# Patient Record
Sex: Female | Born: 1945 | Race: Black or African American | Hispanic: No | Smoking: Former smoker
Health system: Southern US, Community
[De-identification: ages and names within clinical notes are randomized; demographics above are authoritative.]

## PROBLEM LIST (undated history)

## (undated) DIAGNOSIS — R011 Cardiac murmur, unspecified: Secondary | ICD-10-CM

## (undated) DIAGNOSIS — R002 Palpitations: Secondary | ICD-10-CM

## (undated) DIAGNOSIS — E7800 Pure hypercholesterolemia, unspecified: Secondary | ICD-10-CM

## (undated) DIAGNOSIS — F329 Major depressive disorder, single episode, unspecified: Secondary | ICD-10-CM

## (undated) DIAGNOSIS — G4730 Sleep apnea, unspecified: Secondary | ICD-10-CM

## (undated) DIAGNOSIS — I8290 Acute embolism and thrombosis of unspecified vein: Secondary | ICD-10-CM

## (undated) DIAGNOSIS — K279 Peptic ulcer, site unspecified, unspecified as acute or chronic, without hemorrhage or perforation: Secondary | ICD-10-CM

## (undated) DIAGNOSIS — I10 Essential (primary) hypertension: Secondary | ICD-10-CM

## (undated) DIAGNOSIS — F419 Anxiety disorder, unspecified: Secondary | ICD-10-CM

## (undated) DIAGNOSIS — F32A Depression, unspecified: Secondary | ICD-10-CM

## (undated) DIAGNOSIS — K219 Gastro-esophageal reflux disease without esophagitis: Secondary | ICD-10-CM

## (undated) DIAGNOSIS — J40 Bronchitis, not specified as acute or chronic: Secondary | ICD-10-CM

## (undated) DIAGNOSIS — M069 Rheumatoid arthritis, unspecified: Secondary | ICD-10-CM

## (undated) HISTORY — PX: CHOLECYSTECTOMY: SHX55

## (undated) HISTORY — DX: Cardiac murmur, unspecified: R01.1

## (undated) HISTORY — DX: Rheumatoid arthritis, unspecified: M06.9

## (undated) HISTORY — PX: VAGINAL HYSTERECTOMY: SUR661

## (undated) HISTORY — DX: Anxiety disorder, unspecified: F41.9

## (undated) HISTORY — PX: NOSE SURGERY: SHX723

## (undated) HISTORY — PX: FOOT SURGERY: SHX648

## (undated) HISTORY — PX: MOUTH SURGERY: SHX715

## (undated) HISTORY — DX: Major depressive disorder, single episode, unspecified: F32.9

## (undated) HISTORY — DX: Bronchitis, not specified as acute or chronic: J40

## (undated) HISTORY — PX: EYE SURGERY: SHX253

## (undated) HISTORY — DX: Sleep apnea, unspecified: G47.30

## (undated) HISTORY — DX: Pure hypercholesterolemia, unspecified: E78.00

## (undated) HISTORY — DX: Peptic ulcer, site unspecified, unspecified as acute or chronic, without hemorrhage or perforation: K27.9

## (undated) HISTORY — DX: Palpitations: R00.2

## (undated) HISTORY — DX: Depression, unspecified: F32.A

## (undated) HISTORY — DX: Essential (primary) hypertension: I10

## (undated) HISTORY — DX: Gastro-esophageal reflux disease without esophagitis: K21.9

## (undated) HISTORY — DX: Acute embolism and thrombosis of unspecified vein: I82.90

---

## 1998-09-02 ENCOUNTER — Other Ambulatory Visit: Admission: RE | Admit: 1998-09-02 | Discharge: 1998-09-02 | Payer: Self-pay | Admitting: Obstetrics and Gynecology

## 1998-09-12 ENCOUNTER — Emergency Department (HOSPITAL_COMMUNITY): Admission: EM | Admit: 1998-09-12 | Discharge: 1998-09-13 | Payer: Self-pay | Admitting: Emergency Medicine

## 1998-09-13 ENCOUNTER — Encounter: Payer: Self-pay | Admitting: Emergency Medicine

## 1998-11-18 ENCOUNTER — Emergency Department (HOSPITAL_COMMUNITY): Admission: EM | Admit: 1998-11-18 | Discharge: 1998-11-19 | Payer: Self-pay | Admitting: Emergency Medicine

## 1998-11-18 ENCOUNTER — Encounter: Payer: Self-pay | Admitting: Emergency Medicine

## 1999-01-06 ENCOUNTER — Encounter: Payer: Self-pay | Admitting: Endocrinology

## 1999-01-06 ENCOUNTER — Ambulatory Visit (HOSPITAL_COMMUNITY): Admission: RE | Admit: 1999-01-06 | Discharge: 1999-01-06 | Payer: Self-pay | Admitting: Endocrinology

## 1999-02-28 ENCOUNTER — Emergency Department (HOSPITAL_COMMUNITY): Admission: EM | Admit: 1999-02-28 | Discharge: 1999-02-28 | Payer: Self-pay

## 1999-11-17 ENCOUNTER — Encounter: Payer: Self-pay | Admitting: Emergency Medicine

## 1999-11-17 ENCOUNTER — Emergency Department (HOSPITAL_COMMUNITY): Admission: EM | Admit: 1999-11-17 | Discharge: 1999-11-17 | Payer: Self-pay | Admitting: Emergency Medicine

## 2000-01-11 ENCOUNTER — Emergency Department (HOSPITAL_COMMUNITY): Admission: EM | Admit: 2000-01-11 | Discharge: 2000-01-11 | Payer: Self-pay | Admitting: Emergency Medicine

## 2000-01-11 ENCOUNTER — Encounter: Payer: Self-pay | Admitting: Emergency Medicine

## 2000-04-08 ENCOUNTER — Other Ambulatory Visit: Admission: RE | Admit: 2000-04-08 | Discharge: 2000-04-08 | Payer: Self-pay | Admitting: Obstetrics and Gynecology

## 2001-05-03 ENCOUNTER — Ambulatory Visit (HOSPITAL_BASED_OUTPATIENT_CLINIC_OR_DEPARTMENT_OTHER): Admission: RE | Admit: 2001-05-03 | Discharge: 2001-05-03 | Payer: Self-pay | Admitting: Otolaryngology

## 2001-09-15 ENCOUNTER — Other Ambulatory Visit: Admission: RE | Admit: 2001-09-15 | Discharge: 2001-09-15 | Payer: Self-pay | Admitting: Obstetrics and Gynecology

## 2001-12-06 ENCOUNTER — Ambulatory Visit: Admission: RE | Admit: 2001-12-06 | Discharge: 2001-12-06 | Payer: Self-pay | Admitting: Obstetrics and Gynecology

## 2001-12-06 ENCOUNTER — Encounter: Payer: Self-pay | Admitting: Obstetrics and Gynecology

## 2001-12-12 ENCOUNTER — Emergency Department (HOSPITAL_COMMUNITY): Admission: EM | Admit: 2001-12-12 | Discharge: 2001-12-12 | Payer: Self-pay | Admitting: Emergency Medicine

## 2001-12-30 ENCOUNTER — Ambulatory Visit (HOSPITAL_COMMUNITY): Admission: RE | Admit: 2001-12-30 | Discharge: 2001-12-30 | Payer: Self-pay | Admitting: Obstetrics and Gynecology

## 2002-01-25 ENCOUNTER — Encounter: Payer: Self-pay | Admitting: *Deleted

## 2002-01-25 ENCOUNTER — Encounter: Admission: RE | Admit: 2002-01-25 | Discharge: 2002-01-25 | Payer: Self-pay | Admitting: *Deleted

## 2002-03-02 ENCOUNTER — Ambulatory Visit (HOSPITAL_COMMUNITY): Admission: RE | Admit: 2002-03-02 | Discharge: 2002-03-02 | Payer: Self-pay | Admitting: *Deleted

## 2002-04-13 ENCOUNTER — Encounter: Admission: RE | Admit: 2002-04-13 | Discharge: 2002-04-13 | Payer: Self-pay | Admitting: Obstetrics and Gynecology

## 2002-04-13 ENCOUNTER — Encounter: Payer: Self-pay | Admitting: Obstetrics and Gynecology

## 2002-05-29 ENCOUNTER — Encounter: Payer: Self-pay | Admitting: Ophthalmology

## 2002-05-29 ENCOUNTER — Encounter: Admission: RE | Admit: 2002-05-29 | Discharge: 2002-05-29 | Payer: Self-pay | Admitting: Ophthalmology

## 2002-10-25 ENCOUNTER — Other Ambulatory Visit: Admission: RE | Admit: 2002-10-25 | Discharge: 2002-10-25 | Payer: Self-pay | Admitting: Obstetrics and Gynecology

## 2002-11-24 ENCOUNTER — Emergency Department (HOSPITAL_COMMUNITY): Admission: EM | Admit: 2002-11-24 | Discharge: 2002-11-24 | Payer: Self-pay | Admitting: Emergency Medicine

## 2002-11-24 ENCOUNTER — Encounter: Payer: Self-pay | Admitting: Emergency Medicine

## 2002-12-01 ENCOUNTER — Encounter: Admission: RE | Admit: 2002-12-01 | Discharge: 2002-12-01 | Payer: Self-pay | Admitting: Endocrinology

## 2002-12-01 ENCOUNTER — Encounter: Payer: Self-pay | Admitting: Endocrinology

## 2003-09-25 ENCOUNTER — Emergency Department (HOSPITAL_COMMUNITY): Admission: EM | Admit: 2003-09-25 | Discharge: 2003-09-26 | Payer: Self-pay

## 2003-10-18 ENCOUNTER — Emergency Department (HOSPITAL_COMMUNITY): Admission: EM | Admit: 2003-10-18 | Discharge: 2003-10-18 | Payer: Self-pay | Admitting: Emergency Medicine

## 2003-11-05 ENCOUNTER — Ambulatory Visit (HOSPITAL_COMMUNITY): Admission: RE | Admit: 2003-11-05 | Discharge: 2003-11-05 | Payer: Self-pay | Admitting: Cardiology

## 2003-12-25 ENCOUNTER — Ambulatory Visit (HOSPITAL_COMMUNITY): Admission: RE | Admit: 2003-12-25 | Discharge: 2003-12-25 | Payer: Self-pay | Admitting: Endocrinology

## 2004-03-12 ENCOUNTER — Emergency Department (HOSPITAL_COMMUNITY): Admission: EM | Admit: 2004-03-12 | Discharge: 2004-03-12 | Payer: Self-pay | Admitting: Emergency Medicine

## 2004-05-04 ENCOUNTER — Emergency Department (HOSPITAL_COMMUNITY): Admission: EM | Admit: 2004-05-04 | Discharge: 2004-05-04 | Payer: Self-pay | Admitting: *Deleted

## 2004-05-19 ENCOUNTER — Other Ambulatory Visit: Admission: RE | Admit: 2004-05-19 | Discharge: 2004-05-19 | Payer: Self-pay | Admitting: Obstetrics and Gynecology

## 2004-07-03 ENCOUNTER — Ambulatory Visit (HOSPITAL_COMMUNITY): Admission: RE | Admit: 2004-07-03 | Discharge: 2004-07-03 | Payer: Self-pay | Admitting: Obstetrics and Gynecology

## 2004-11-08 ENCOUNTER — Emergency Department (HOSPITAL_COMMUNITY): Admission: EM | Admit: 2004-11-08 | Discharge: 2004-11-09 | Payer: Self-pay | Admitting: Emergency Medicine

## 2005-10-12 ENCOUNTER — Encounter (INDEPENDENT_AMBULATORY_CARE_PROVIDER_SITE_OTHER): Payer: Self-pay | Admitting: *Deleted

## 2005-10-13 ENCOUNTER — Inpatient Hospital Stay (HOSPITAL_COMMUNITY): Admission: RE | Admit: 2005-10-13 | Discharge: 2005-10-14 | Payer: Self-pay

## 2006-03-26 ENCOUNTER — Encounter: Admission: RE | Admit: 2006-03-26 | Discharge: 2006-03-26 | Payer: Self-pay | Admitting: Endocrinology

## 2006-06-29 ENCOUNTER — Ambulatory Visit (HOSPITAL_COMMUNITY): Admission: RE | Admit: 2006-06-29 | Discharge: 2006-06-29 | Payer: Self-pay | Admitting: *Deleted

## 2006-07-05 ENCOUNTER — Ambulatory Visit (HOSPITAL_COMMUNITY): Admission: RE | Admit: 2006-07-05 | Discharge: 2006-07-05 | Payer: Self-pay | Admitting: *Deleted

## 2006-07-21 ENCOUNTER — Emergency Department (HOSPITAL_COMMUNITY): Admission: EM | Admit: 2006-07-21 | Discharge: 2006-07-21 | Payer: Self-pay | Admitting: Emergency Medicine

## 2006-09-19 ENCOUNTER — Inpatient Hospital Stay (HOSPITAL_COMMUNITY): Admission: EM | Admit: 2006-09-19 | Discharge: 2006-09-25 | Payer: Self-pay | Admitting: Emergency Medicine

## 2006-09-21 ENCOUNTER — Encounter (INDEPENDENT_AMBULATORY_CARE_PROVIDER_SITE_OTHER): Payer: Self-pay | Admitting: *Deleted

## 2007-02-08 ENCOUNTER — Emergency Department (HOSPITAL_COMMUNITY): Admission: EM | Admit: 2007-02-08 | Discharge: 2007-02-09 | Payer: Self-pay | Admitting: *Deleted

## 2007-04-05 ENCOUNTER — Emergency Department (HOSPITAL_COMMUNITY): Admission: EM | Admit: 2007-04-05 | Discharge: 2007-04-06 | Payer: Self-pay | Admitting: Emergency Medicine

## 2007-09-18 ENCOUNTER — Encounter: Admission: RE | Admit: 2007-09-18 | Discharge: 2007-09-18 | Payer: Self-pay | Admitting: Internal Medicine

## 2009-02-28 ENCOUNTER — Emergency Department (HOSPITAL_COMMUNITY): Admission: EM | Admit: 2009-02-28 | Discharge: 2009-03-01 | Payer: Self-pay | Admitting: Emergency Medicine

## 2009-04-09 ENCOUNTER — Ambulatory Visit (HOSPITAL_BASED_OUTPATIENT_CLINIC_OR_DEPARTMENT_OTHER): Admission: RE | Admit: 2009-04-09 | Discharge: 2009-04-09 | Payer: Self-pay | Admitting: Urology

## 2009-07-02 ENCOUNTER — Emergency Department (HOSPITAL_COMMUNITY): Admission: EM | Admit: 2009-07-02 | Discharge: 2009-07-02 | Payer: Self-pay | Admitting: Emergency Medicine

## 2009-07-04 ENCOUNTER — Emergency Department (HOSPITAL_COMMUNITY): Admission: EM | Admit: 2009-07-04 | Discharge: 2009-07-04 | Payer: Self-pay | Admitting: Emergency Medicine

## 2009-07-06 ENCOUNTER — Inpatient Hospital Stay (HOSPITAL_COMMUNITY): Admission: EM | Admit: 2009-07-06 | Discharge: 2009-07-11 | Payer: Self-pay | Admitting: Emergency Medicine

## 2009-07-08 ENCOUNTER — Encounter (INDEPENDENT_AMBULATORY_CARE_PROVIDER_SITE_OTHER): Payer: Self-pay | Admitting: Internal Medicine

## 2009-07-10 ENCOUNTER — Ambulatory Visit: Payer: Self-pay | Admitting: Physical Medicine & Rehabilitation

## 2010-07-01 ENCOUNTER — Emergency Department (HOSPITAL_COMMUNITY)
Admission: EM | Admit: 2010-07-01 | Discharge: 2010-07-01 | Payer: Self-pay | Source: Home / Self Care | Admitting: Emergency Medicine

## 2010-07-13 ENCOUNTER — Encounter
Admission: RE | Admit: 2010-07-13 | Discharge: 2010-07-13 | Payer: Self-pay | Source: Home / Self Care | Attending: Orthopedic Surgery | Admitting: Orthopedic Surgery

## 2010-07-18 ENCOUNTER — Encounter
Admission: RE | Admit: 2010-07-18 | Discharge: 2010-07-18 | Payer: Self-pay | Source: Home / Self Care | Attending: Orthopedic Surgery | Admitting: Orthopedic Surgery

## 2010-08-13 ENCOUNTER — Encounter
Admission: RE | Admit: 2010-08-13 | Discharge: 2010-08-13 | Payer: Self-pay | Source: Home / Self Care | Attending: Orthopedic Surgery | Admitting: Orthopedic Surgery

## 2010-08-17 ENCOUNTER — Encounter: Payer: Self-pay | Admitting: Endocrinology

## 2010-08-17 ENCOUNTER — Encounter: Payer: Self-pay | Admitting: *Deleted

## 2010-09-12 ENCOUNTER — Other Ambulatory Visit: Payer: Self-pay | Admitting: Orthopedic Surgery

## 2010-09-12 DIAGNOSIS — IMO0002 Reserved for concepts with insufficient information to code with codable children: Secondary | ICD-10-CM

## 2010-09-16 ENCOUNTER — Ambulatory Visit
Admission: RE | Admit: 2010-09-16 | Discharge: 2010-09-16 | Disposition: A | Discharge status: Home / Self Care | Payer: Medicare Other | Source: Ambulatory Visit | Attending: Orthopedic Surgery | Admitting: Orthopedic Surgery

## 2010-09-16 DIAGNOSIS — IMO0002 Reserved for concepts with insufficient information to code with codable children: Secondary | ICD-10-CM

## 2010-10-06 LAB — URINALYSIS, ROUTINE W REFLEX MICROSCOPIC
Glucose, UA: NEGATIVE mg/dL
Protein, ur: NEGATIVE mg/dL
Specific Gravity, Urine: 1.016 (ref 1.005–1.030)
pH: 7.5 (ref 5.0–8.0)

## 2010-10-06 LAB — POCT I-STAT, CHEM 8
Calcium, Ion: 0.95 mmol/L — ABNORMAL LOW (ref 1.12–1.32)
Creatinine, Ser: 0.8 mg/dL (ref 0.4–1.2)
Glucose, Bld: 99 mg/dL (ref 70–99)
Hemoglobin: 14.3 g/dL (ref 12.0–15.0)
Sodium: 137 mEq/L (ref 135–145)
TCO2: 30 mmol/L (ref 0–100)

## 2010-10-06 LAB — CBC
MCH: 29.6 pg (ref 26.0–34.0)
MCV: 91.3 fL (ref 78.0–100.0)
Platelets: 167 10*3/uL (ref 150–400)
RBC: 4.25 MIL/uL (ref 3.87–5.11)

## 2010-10-06 LAB — URINE CULTURE: Culture  Setup Time: 201112070146

## 2010-10-06 LAB — DIFFERENTIAL
Eosinophils Absolute: 0 10*3/uL (ref 0.0–0.7)
Eosinophils Relative: 1 % (ref 0–5)
Lymphs Abs: 1.6 10*3/uL (ref 0.7–4.0)

## 2010-10-09 HISTORY — PX: OTHER SURGICAL HISTORY: SHX169

## 2010-10-28 LAB — CARDIAC PANEL(CRET KIN+CKTOT+MB+TROPI)
CK, MB: 0.6 ng/mL (ref 0.3–4.0)
Relative Index: INVALID (ref 0.0–2.5)
Total CK: 123 U/L (ref 7–177)
Total CK: 93 U/L (ref 7–177)
Troponin I: 0.01 ng/mL (ref 0.00–0.06)
Troponin I: 0.02 ng/mL (ref 0.00–0.06)

## 2010-10-28 LAB — POCT I-STAT, CHEM 8
BUN: 15 mg/dL (ref 6–23)
Calcium, Ion: 1.12 mmol/L (ref 1.12–1.32)
Chloride: 104 mEq/L (ref 96–112)
Creatinine, Ser: 0.7 mg/dL (ref 0.4–1.2)
Glucose, Bld: 116 mg/dL — ABNORMAL HIGH (ref 70–99)
TCO2: 29 mmol/L (ref 0–100)

## 2010-10-28 LAB — DIFFERENTIAL
Basophils Absolute: 0 10*3/uL (ref 0.0–0.1)
Basophils Absolute: 0 10*3/uL (ref 0.0–0.1)
Basophils Relative: 0 % (ref 0–1)
Basophils Relative: 1 % (ref 0–1)
Eosinophils Absolute: 0 10*3/uL (ref 0.0–0.7)
Eosinophils Absolute: 0 10*3/uL (ref 0.0–0.7)
Eosinophils Relative: 1 % (ref 0–5)
Lymphs Abs: 1.6 10*3/uL (ref 0.7–4.0)
Neutro Abs: 1.8 10*3/uL (ref 1.7–7.7)
Neutrophils Relative %: 40 % — ABNORMAL LOW (ref 43–77)
Neutrophils Relative %: 53 % (ref 43–77)

## 2010-10-28 LAB — BASIC METABOLIC PANEL
BUN: 12 mg/dL (ref 6–23)
BUN: 3 mg/dL — ABNORMAL LOW (ref 6–23)
BUN: 5 mg/dL — ABNORMAL LOW (ref 6–23)
CO2: 23 mEq/L (ref 19–32)
CO2: 27 mEq/L (ref 19–32)
Calcium: 8 mg/dL — ABNORMAL LOW (ref 8.4–10.5)
Calcium: 9.2 mg/dL (ref 8.4–10.5)
Calcium: 9.7 mg/dL (ref 8.4–10.5)
Chloride: 113 mEq/L — ABNORMAL HIGH (ref 96–112)
Creatinine, Ser: 0.62 mg/dL (ref 0.4–1.2)
Creatinine, Ser: 0.69 mg/dL (ref 0.4–1.2)
Creatinine, Ser: 0.85 mg/dL (ref 0.4–1.2)
GFR calc Af Amer: >60 mL/min (ref >60)
GFR calc Af Amer: >60 mL/min (ref >60)
GFR calc Af Amer: >60 mL/min (ref >60)
GFR calc Af Amer: >60 mL/min (ref >60)
Glucose, Bld: 84 mg/dL (ref 70–99)
Potassium: 3.1 mEq/L — ABNORMAL LOW (ref 3.5–5.1)
Sodium: 143 mEq/L (ref 135–145)

## 2010-10-28 LAB — PROTIME-INR: Prothrombin Time: 13.6 seconds (ref 11.6–15.2)

## 2010-10-28 LAB — CBC
Hemoglobin: 13.2 g/dL (ref 12.0–15.0)
MCHC: 32.3 g/dL (ref 30.0–36.0)
MCHC: 32.8 g/dL (ref 30.0–36.0)
MCHC: 32.8 g/dL (ref 30.0–36.0)
MCV: 93.6 fL (ref 78.0–100.0)
Platelets: 128 10*3/uL — ABNORMAL LOW (ref 150–400)
Platelets: 179 10*3/uL (ref 150–400)
RBC: 3.42 MIL/uL — ABNORMAL LOW (ref 3.87–5.11)
RBC: 4.3 MIL/uL (ref 3.87–5.11)
RDW: 14 % (ref 11.5–15.5)
WBC: 4.1 10*3/uL (ref 4.0–10.5)

## 2010-10-28 LAB — CK TOTAL AND CKMB (NOT AT ARMC)
CK, MB: 1 ng/mL (ref 0.3–4.0)
Total CK: 104 U/L (ref 7–177)

## 2010-10-28 LAB — CULTURE, BLOOD (ROUTINE X 2): Culture: NO GROWTH

## 2010-10-28 LAB — POCT CARDIAC MARKERS
CKMB, poc: 1.1 ng/mL (ref 1.0–8.0)
CKMB, poc: <1 ng/mL — ABNORMAL LOW (ref 1.0–8.0)
Myoglobin, poc: 87 ng/mL (ref 12–200)

## 2010-10-28 LAB — LIPID PANEL
LDL Cholesterol: 71 mg/dL (ref 0–99)
VLDL: 16 mg/dL (ref 0–40)

## 2010-10-28 LAB — URINALYSIS, ROUTINE W REFLEX MICROSCOPIC
Bilirubin Urine: NEGATIVE
Hgb urine dipstick: NEGATIVE
Specific Gravity, Urine: >1.046 — ABNORMAL HIGH (ref 1.005–1.030)
pH: 5.5 (ref 5.0–8.0)

## 2010-10-28 LAB — PHOSPHORUS
Phosphorus: 2.1 mg/dL — ABNORMAL LOW (ref 2.3–4.6)
Phosphorus: 2.4 mg/dL (ref 2.3–4.6)

## 2010-10-28 LAB — COMPREHENSIVE METABOLIC PANEL
ALT: 18 U/L (ref 0–35)
AST: 22 U/L (ref 0–37)
CO2: 26 mEq/L (ref 19–32)
Calcium: 9.9 mg/dL (ref 8.4–10.5)
Chloride: 105 mEq/L (ref 96–112)
GFR calc non Af Amer: >60 mL/min (ref >60)
Glucose, Bld: 116 mg/dL — ABNORMAL HIGH (ref 70–99)
Sodium: 141 mEq/L (ref 135–145)
Total Bilirubin: 0.7 mg/dL (ref 0.3–1.2)

## 2010-10-28 LAB — RAPID URINE DRUG SCREEN, HOSP PERFORMED
Benzodiazepines: NOT DETECTED
Cocaine: NOT DETECTED
Tetrahydrocannabinol: NOT DETECTED

## 2010-10-28 LAB — MAGNESIUM
Magnesium: 2.1 mg/dL (ref 1.5–2.5)
Magnesium: 2.1 mg/dL (ref 1.5–2.5)

## 2010-10-28 LAB — RPR: RPR Ser Ql: NONREACTIVE

## 2010-10-28 LAB — TROPONIN I: Troponin I: 0.01 ng/mL (ref 0.00–0.06)

## 2010-10-28 LAB — URINE CULTURE

## 2010-10-28 LAB — LIPASE, BLOOD: Lipase: 21 U/L (ref 11–59)

## 2010-10-28 LAB — HIV ANTIBODY (ROUTINE TESTING W REFLEX): HIV: NONREACTIVE

## 2010-10-28 LAB — SEDIMENTATION RATE: Sed Rate: 9 mm/hr (ref 0–22)

## 2010-10-28 LAB — LACTIC ACID, PLASMA: Lactic Acid, Venous: 1 mmol/L (ref 0.5–2.2)

## 2010-10-31 LAB — POCT I-STAT 4, (NA,K, GLUC, HGB,HCT)
Potassium: 3.6 mEq/L (ref 3.5–5.1)
Sodium: 141 mEq/L (ref 135–145)

## 2010-11-01 ENCOUNTER — Emergency Department (HOSPITAL_COMMUNITY): Payer: Medicare Other

## 2010-11-01 ENCOUNTER — Emergency Department (HOSPITAL_COMMUNITY)
Admission: EM | Admit: 2010-11-01 | Discharge: 2010-11-01 | Disposition: A | Discharge status: Home / Self Care | Payer: Medicare Other | Attending: Emergency Medicine | Admitting: Emergency Medicine

## 2010-11-01 DIAGNOSIS — M25559 Pain in unspecified hip: Secondary | ICD-10-CM | POA: Insufficient documentation

## 2010-11-01 DIAGNOSIS — M545 Low back pain, unspecified: Secondary | ICD-10-CM | POA: Insufficient documentation

## 2010-11-01 DIAGNOSIS — I10 Essential (primary) hypertension: Secondary | ICD-10-CM | POA: Insufficient documentation

## 2010-11-01 DIAGNOSIS — M5430 Sciatica, unspecified side: Secondary | ICD-10-CM | POA: Insufficient documentation

## 2010-11-01 LAB — CBC
HCT: 34.1 % — ABNORMAL LOW (ref 36.0–46.0)
Hemoglobin: 11.2 g/dL — ABNORMAL LOW (ref 12.0–15.0)
MCHC: 32.8 g/dL (ref 30.0–36.0)
Platelets: 147 10*3/uL — ABNORMAL LOW (ref 150–400)
RDW: 13.9 % (ref 11.5–15.5)

## 2010-11-01 LAB — DIFFERENTIAL
Basophils Absolute: 0.1 10*3/uL (ref 0.0–0.1)
Basophils Relative: 1 % (ref 0–1)
Eosinophils Absolute: 0.1 10*3/uL (ref 0.0–0.7)
Eosinophils Relative: 1 % (ref 0–5)
Lymphocytes Relative: 43 % (ref 12–46)
Monocytes Absolute: 0.5 10*3/uL (ref 0.1–1.0)

## 2010-11-01 LAB — COMPREHENSIVE METABOLIC PANEL
ALT: 15 U/L (ref 0–35)
Alkaline Phosphatase: 54 U/L (ref 39–117)
BUN: 10 mg/dL (ref 6–23)
CO2: 27 mEq/L (ref 19–32)
Calcium: 8.9 mg/dL (ref 8.4–10.5)
GFR calc Af Amer: >60 mL/min (ref >60)
GFR calc non Af Amer: >60 mL/min (ref >60)
Glucose, Bld: 103 mg/dL — ABNORMAL HIGH (ref 70–99)
Potassium: 4.3 mEq/L (ref 3.5–5.1)
Total Protein: 6.2 g/dL (ref 6.0–8.3)

## 2010-11-01 LAB — URINALYSIS, ROUTINE W REFLEX MICROSCOPIC
Bilirubin Urine: NEGATIVE
Glucose, UA: NEGATIVE mg/dL
Ketones, ur: NEGATIVE mg/dL
Specific Gravity, Urine: 1.008 (ref 1.005–1.030)
pH: 8 (ref 5.0–8.0)

## 2010-11-01 LAB — POCT CARDIAC MARKERS: Troponin i, poc: <0.05 ng/mL (ref 0.00–0.09)

## 2010-11-01 LAB — RAPID URINE DRUG SCREEN, HOSP PERFORMED
Benzodiazepines: NOT DETECTED
Cocaine: NOT DETECTED
Opiates: NOT DETECTED

## 2010-11-01 LAB — LIPASE, BLOOD: Lipase: 39 U/L (ref 11–59)

## 2010-11-06 ENCOUNTER — Other Ambulatory Visit: Payer: Self-pay | Admitting: Family Medicine

## 2010-11-06 DIAGNOSIS — M545 Low back pain, unspecified: Secondary | ICD-10-CM

## 2010-11-07 ENCOUNTER — Ambulatory Visit
Admission: RE | Admit: 2010-11-07 | Discharge: 2010-11-07 | Disposition: A | Discharge status: Home / Self Care | Payer: Medicare Other | Source: Ambulatory Visit | Attending: Family Medicine | Admitting: Family Medicine

## 2010-11-07 DIAGNOSIS — M545 Low back pain, unspecified: Secondary | ICD-10-CM

## 2010-11-20 ENCOUNTER — Other Ambulatory Visit (HOSPITAL_COMMUNITY): Payer: Medicare Other

## 2010-11-26 HISTORY — PX: DOPPLER ECHOCARDIOGRAPHY: SHX263

## 2010-11-28 HISTORY — PX: NM MYOVIEW LTD: HXRAD82

## 2010-12-09 ENCOUNTER — Observation Stay (HOSPITAL_COMMUNITY)
Admission: RE | Admit: 2010-12-09 | Discharge: 2010-12-12 | Disposition: A | Discharge status: Home / Self Care | Payer: Medicare Other | Source: Ambulatory Visit | Attending: Specialist | Admitting: Specialist

## 2010-12-09 ENCOUNTER — Ambulatory Visit (HOSPITAL_COMMUNITY): Payer: Medicare Other

## 2010-12-09 DIAGNOSIS — Z79899 Other long term (current) drug therapy: Secondary | ICD-10-CM | POA: Insufficient documentation

## 2010-12-09 DIAGNOSIS — Z01818 Encounter for other preprocedural examination: Secondary | ICD-10-CM | POA: Insufficient documentation

## 2010-12-09 DIAGNOSIS — M5126 Other intervertebral disc displacement, lumbar region: Principal | ICD-10-CM | POA: Insufficient documentation

## 2010-12-09 LAB — SURGICAL PCR SCREEN: Staphylococcus aureus: POSITIVE — AB

## 2010-12-09 LAB — URINALYSIS, ROUTINE W REFLEX MICROSCOPIC
Glucose, UA: NEGATIVE mg/dL
Ketones, ur: NEGATIVE mg/dL
Protein, ur: NEGATIVE mg/dL
Urobilinogen, UA: 0.2 mg/dL (ref 0.0–1.0)

## 2010-12-09 LAB — BASIC METABOLIC PANEL
BUN: 13 mg/dL (ref 6–23)
CO2: 28 mEq/L (ref 19–32)
Calcium: 10 mg/dL (ref 8.4–10.5)
Chloride: 94 mEq/L — ABNORMAL LOW (ref 96–112)
Creatinine, Ser: 0.69 mg/dL (ref 0.4–1.2)
GFR calc Af Amer: >60 mL/min (ref >60)
GFR calc non Af Amer: >60 mL/min (ref >60)
Glucose, Bld: 129 mg/dL — ABNORMAL HIGH (ref 70–99)
Potassium: 4 mEq/L (ref 3.5–5.1)
Sodium: 131 mEq/L — ABNORMAL LOW (ref 135–145)

## 2010-12-09 LAB — PROTIME-INR
INR: 0.92 (ref 0.00–1.49)
Prothrombin Time: 12.6 seconds (ref 11.6–15.2)

## 2010-12-09 LAB — CBC
HCT: 39 % (ref 36.0–46.0)
Hemoglobin: 12.9 g/dL (ref 12.0–15.0)
MCH: 30.6 pg (ref 26.0–34.0)
MCHC: 33.1 g/dL (ref 30.0–36.0)
MCV: 92.4 fL (ref 78.0–100.0)
Platelets: 215 10*3/uL (ref 150–400)
RBC: 4.22 MIL/uL (ref 3.87–5.11)
RDW: 13.2 % (ref 11.5–15.5)
WBC: 5.1 10*3/uL (ref 4.0–10.5)

## 2010-12-09 LAB — DIFFERENTIAL
Basophils Absolute: 0 10*3/uL (ref 0.0–0.1)
Basophils Relative: 0 % (ref 0–1)
Eosinophils Absolute: 0 10*3/uL (ref 0.0–0.7)
Eosinophils Relative: 1 % (ref 0–5)
Lymphs Abs: 2.2 10*3/uL (ref 0.7–4.0)
Neutrophils Relative %: 47 % (ref 43–77)

## 2010-12-10 NOTE — Op Note (Signed)
NAMEANVITHA, HUTMACHER              ACCOUNT NO.:  [id number]  MEDICAL RECORD NO.:  [id number]           PATIENT TYPE:  O  LOCATION:  5008                         FACILITY:  [facility]  PHYSICIAN:  Kerrin Champagne, M.D.   DATE OF BIRTH:  06/17/1946  DATE OF PROCEDURE:  12/09/2010 DATE OF DISCHARGE:                              OPERATIVE REPORT   PREOPERATIVE DIAGNOSIS:  Herniated nucleus pulposus, left side, L5-S1.  POSTOPERATIVE DIAGNOSIS:  Herniated nucleus pulposus, left side, L5-S1.  PROCEDURE:  Left L5-S1 MIS approach for microdiskectomy, microscope used during the procedure.  SURGEON:  Kerrin Champagne, MD  ASSISTANT:  Maud Deed, PA-C  ANESTHESIA:  General via orotracheal intubation, Dr. Diamantina Monks, supplemented with local infiltration with Marcaine 0.5% with 1:200,000 epinephrine, total of 9 mL.  FINDINGS:  Large HNP subligamentous with extruded fragment central and left side affecting the left-sided thecal sac and the left S1 nerve root, L5-S1.  ESTIMATED BLOOD LOSS:  Less than 10 mL.  COMPLICATIONS:  None.  DISPOSITION:  The patient returned to the PACU in good condition.  HISTORY OF PRESENT ILLNESS:  The patient is a 65 year old female who had disk herniation occur almost a year ago.  She was seen initially, underwent epidural steroid injections with good relief of pain, and has been treated conservatively over the past year.  This last December her pain worsened and she underwent further ESIs without relief.  MRI scan was done which demonstrated a large disk herniation central and left side at L5-S1 with left S1 nerve root compression.  Her findings clinically were consistent with S1 radiculopathy.  After attempts at further conservative management including therapy, attempts at further ESIs with use of narcotic medicines, pain has persisted with significant sciatic tension signs on the left side.  She is brought to the operating room to undergo microdiskectomy  for persistent sciatica, left lower extremity that is incapacitating, requiring narcotic medications over a prolonged period of time and without relief with conservative management over a long period of greater than 6 months.  DESCRIPTION OF PROCEDURE:  This patient had undergone previous evaluation at the office and a discussion regarding the risks and benefits of the operation to be performed.  She had signed informed consent.  The patient was seen in the preoperative holding area and had marking of the left lower back with an X and my initials at the expected L5-S1 level.  She received standard preoperative antibiotics of Ancef. She was transported the OR room #4 at [facility].  There she underwent induction of general anesthesia, intubation atraumatically, and Foley catheter placed.  She was then turned to prone position.  A sliding OR table was used with Wilson frame.  All pressure points were well-padded.  PAS hose.  Standard prep with DuraPrep solution from the lower dorsal spine to the mid sacral segment draped in the usual manner and then the I-drape was used.  Standard time-out protocol was carried out identifying the patient, procedure, the site of the procedure, expected blood loss, and length of the procedure.  Two spinal needles 18- gauge were then inserted at the expected L5-S1  level.  Intraoperative lateral C-arm brought into the field under the C-arm that was brought in sterilely.  Needles noted to be on either side of the disk space over the posterior aspect of posterior elements of the L5-S1 level so that the skin was infiltrated between these needles using Marcaine 0.5% with 1:200,000 epinephrine.  Needles were removed.  A small 1-inch incision was made into the skin and subcu layers down to the fascia.  This was then incised on the left side of the expected spinous process of L5-S1. Smallest muscle dilator was brought down and used to dilate the paralumbar  muscles on the left and these were dilated up to a 11-mm dilator.  A 50-mm depth measured off the dilators and 50-mm small blades were then inserted over the dilators.  In the upright for holding the frame for the MIS equipment, it was then sterilely attached to the OR table.  This was then attached to the frame to hold the retractors in place and this was stabilized.  Once this was stabilized and the retractors were then engaged and the distraction obtained between the upper and lower retractors, the blade was also placed medially.  To obtain excellent exposure at the expected L5-S1 level, a Penfield 4 was placed over the medial aspect of the facet at the L5-S1 level.  Intraoperative lateral C-arm then showed this to be at the level of the facet where the L5-S1 level.  C-arm was then removed from the field.  This permanent image was obtained for documentation purposes identifying the Penfield 4 over the medial facet at the L4, L5, and, S1 level.  Loupe magnification headlamp was used to carefully remove a small portion inferior aspect of lamina of L5 on the left up to the insertion of the ligamentum flavum to the ventral surface of the lamina.  This was then retracted distally with a nerve hook and resected using 3-mm Kerrisons off the posterior aspect of the thecal sac extending downwards to the lamina of S1.  It was then resected off the medial aspect of facet at the L5-S1 level.  Operating room microscope sterilely draped, brought into the field.  Under the OR microscope, then further debridement of medial aspect of L5-S1 facet, ligamentum flavum approximately 5% of the medial facet was resected in order to allow for further exposure of lateral recess.  Thecal sac was retracted medially and the S1 nerve root identified.  Nerve root was carefully freed up. Several large epidural veins were cauterized and divided.  Cottonoid placed distally to protect the S1 nerve root and the disk  herniation was immediately visible ventral to the S1 nerve root and the thecal sac at this level.  The #15 blade scalpel was then used to incise sagittally. The posterior aspect of the posterior longitudinal ligament in the left lateral recess at the L5-S1 level and disk herniation material immediately extruded.  Pituitaries and micro were then used to excise the disk and additional fragments were found within the spinal canal beneath the S1 nerve roots resected using micropituitary.  At least 4-5 separate fragments of disk material were removed from the subligamentous aspect of the L5-S1 disk space that were compressing upon the thecal sac and the S1 nerve root extending downwards over the posterior aspect of the S1 vertebral body and affecting the S1 nerve root and its entry point to its neural foramen.  Using bulbous tip nerve hook as well as hockey stick neural probe, the spinal canal was carefully evaluated.  Further disk material was found in the subligamentous region and this was excised using micropituitaries.  The disk space was entered through the rent that was present in it from the previous disk herniation site. Pituitaries were used to remove loose and disk herniation material and this debris was degenerated within the disk space on the left side and centrally.  Epsteins were used to further decompress the posterior aspect of the central portions of the canal, posterior aspect of the disk area, and the subligamentous area.  Then further pituitary used to debride this as well.  When this was completed, then the posterior longitudinal ligament appeared to be flattened down against posterior aspect of the disk space.  No further compression of the ventral aspect of the thecal sac or S1 nerve roots noted to be present.  Hockey stick neural probe could be passed out the L5 neural foramen as well as S1 neural foramen demonstrating their patency and also passed over the posterior  aspect of disk space demonstrating there was no further disk herniation present.  Irrigation was carried out multiple times throughout the procedure.  With this completed, then cottonoids were removed.  Examination of the nerve root demonstrated some mild erythema of the thecal sac as was nerve root.  The patient was given Decadron 10 mg IV intraoperatively.  She had been receiving prednisone preoperatively 10 mg a day.  With this done, no active bleeding was felt to be present.  The retraction frame was then removed from the stabilizing arm and then carefully the retraction blades were then returned to their normal position without distraction and then with rotation carefully withdrawing the retractor using bipolar electrocautery we controlled bleeders with the exiting of the retractor system.  Irrigation was then carried out.  The lumbodorsal fascia reapproximated in midline with interrupted 0-Vicryl sutures using the UR- 6 needle.  Deep subcu layers were approximated with interrupted 0-Vicryl sutures, more superficial layers with interrupted 2-0 Vicryl sutures, and skin closed with a running subcu stitch of 4-0 Vicryl.  Dermabond was then applied with Mepilex bandage.  The patient then had Foley catheter removed following returned to a supine position.  All instrument and sponge counts were correct.  The patient was then reactivated, extubated, and returned to recovery room in satisfactory condition.  PHYSICIAN ASSISTANT'S RESPONSIBILITY:  Maud Deed performed duties of assistant surgeon during this case and assistant as the physician assistant.  She performed excellent suctioning neural elements blood keeping the field dry under the operating room microscope, present from beginning of the case until the end of the case.  She performed closure of the incision the fascial layer to the skin and the application of Dermabond dressings, assisted with initial positioning as well as  the initial removal from the OR table.     Kerrin Champagne, M.D.     JEN/MEDQ  D:  12/09/2010  T:  12/09/2010  Job:  [id number]  Electronically Signed by Vira Browns M.D. on 12/10/2010 06:10:06 PM

## 2010-12-12 NOTE — Op Note (Signed)
[facility]  Patient:    Donna Payne, COPE Visit Number: [id number] MRN: [id number]          Service Type: DSU Location: [facility] Attending Physician:  Soledad Gerlach Dictated by:   Guy Sandifer Arleta Creek, M.D. Proc. Date: 12/30/01 Admit Date:  12/30/2001                             Operative Report  PREOPERATIVE DIAGNOSIS:       Pelvic pain.  POSTOPERATIVE DIAGNOSIS:      Abdominal/pelvis adhesions.  OPERATION/PROCEDURE:          Laparoscopy with extensive lysis of adhesions.  SURGEON:                      Guy Sandifer. Arleta Creek, M.D.  ANESTHESIA:                   General with endotracheal intubation.  ESTIMATED BLOOD LOSS:         Dropped.  INDICATIONS/CONSENT:          This patient is a 65 year old married black female, status post TAH/BSO, with known pelvic adhesions.  Details are dictated in the History and Physical.  Laparoscopy has been discussed preoperatively.  Potential risks and complications were discussed including, but not limited to, infection, bowel or bladder or ureteral damage, bleeding requiring transfusion of blood products with possible transfusion reaction, HIV, and hepatitis acquisition, DVT, PE, pneumonia, recurrent adhesions, and/or pelvic/abdominal pain.  All questions were answered and consent is signed and on the chart.  FINDINGS:                     There were dense omental adhesions to the left of the midline down to the lower abdomen.  There were additional adhesions of the epiploica to the left pelvic sidewall.  PROCEDURE:                    The patient was taken to the operating room and placed in the dorsal supine position, and general anesthesia was induced via endotracheal intubation.  She was then placed in the dorsal lithotomy position and prepped abdominally and vaginally.  The bladder was straight catheterized. A sponge on a stick was placed in the vagina as a manipulator and she was draped in  sterile fashion.  A small infraumbilical incision was made and dissection carried out in layers to the peritoneum, which was entered safely. The disposable open laparoscopy trocar sleeve was then placed.  It was additionally anchored with 0 Vicryl sutures at each angle of the fascial incision.  A pneumoperitoneum was induced.  Adhesiolysis was carried out at the point of insertion of the anterior abdominal wall to allow placement of a 5 mm suprapubic trocar sleeve, which was placed under direct visualization without difficulty.  Extensive adhesiolysis was carried out of the omental adhesions to the abdominal wall.  This was carried out down to the point of their attachment in the upper pelvis.  The adhesions on the pelvic sidewall were taken down bluntly and sharply, revealing no masses.  Hemostasis was maintained with brief bipolar cautery.  Copious irrigation was carried out. After the adhesion were taken down and hemostasis was noted excess fluid was removed.  The suprapubic trocar sleeve was removed and the pneumoperitoneum reduced, and no bleeding was noted from any site.  The umbilical trocar sleeve  was removed.  The fascia was closed by tying together the two angle sutures in the midline.  The skin was closed with 3-0 Vicryl sutures with mattress type stitches on both incisions.  The incisions were injected with 0.50% plain Marcaine.  All counts were correct.  The patient was awakened and taken to the recovery room in stable condition. Dictated by:   Guy Sandifer Arleta Creek, M.D. Attending Physician:  Soledad Gerlach DD:  12/30/01 TD:  01/02/02 Job: [id number]

## 2010-12-12 NOTE — H&P (Signed)
Donna Payne, Donna Payne NO.:  [id number]   MEDICAL RECORD NO.:  [id number]          PATIENT TYPE:  INP   LOCATION:  0103                         [facility]ACILITY[facility]  [facility]   PHYSICIAN:  Isidor Holts, M.D.  DATE OF BIRTH:  1945/11/06   DATE OF ADMISSION:  09/19/2006  DATE OF DISCHARGE:                              HISTORY & PHYSICAL   PRIMARY MEDICAL DOCTOR:  Brooke Bonito, M.D.   PRIMARY GASTROENTEROLOGIST:  Georgiana Spinner, M.D.   RHEUMATOLOGIST:  Lemmie Evens, M.D.   CHIEF COMPLAINT:  Fever, abdominal pain, vomiting and diarrhea with  query bloody stools for the past 5 days, i.e. since 09/14/2006. Weight  loss of approximately 14 pounds in 3 weeks, associated with diminished  appetite.   HISTORY OF PRESENT COMPLAINT:  This is a 65 year old female.  For past  medical history, see below.  According to the patient, she became unwell  on 09/14/2006 with intermittent upper abdominal pain, associated with  vomiting.  Vomitus consisted of mucoid material no coffee grounds or  hematemesis.  This then subsided.  However, the patient has continued to  have diarrheal stools.  She described her stools as mushy and  bloody. Bowel movements have occurred five to six times a day for the  past 5 days. In addition, the patient admits to fever and chills.  She  went to see her PMD on 09/16/2006 and was found to have a temperature of  103, was given prescriptions for Doxycycline and a cough syrup. As of  09/19/2006, she had completed 4 days of Doxycycline, but claims not to  have felt any better; everything felt worse. Her husband had the  flu; otherwise no other history of sick contacts.  The patient states  that over the past 3 weeks, her appetite has been quite poor; and she  has lost approximately 14 pounds in that period of time.   PAST MEDICAL HISTORY:  1. Remote history of peptic ulcer disease.  2. Genital herpes.  3. Remote history of hepatiti,s query etiology.  4.  Rheumatoid arthritis.  5. Status post ovarian cystectomy x3 in the 1980s, status post total      abdominal hysterectomy with removal of one ovary in 1988.      Subsequently status post laparotomy with removal of second ovary in      1990.  6. Status post gingival surgery in the 1980s.  7. Laparoscopic adhesiolysis in 1993.  8. Dysphagia status post EGD with savary dilatation August 2003, by      Dr. Sabino Gasser.  9. Status post laparoscopic adhesiolysis/cholecystectomy/operative      cholangiogram March 2007 by Dr. Lebron Conners.  10.Status post normal upper gastrointestinal endoscopy 06/29/2006 by      Dr. Sabino Gasser.  11.Dyslipidemia.  12.Bronchial asthma.   MEDICATIONS:  1. Estrace 2 mg p.o. daily.  2. Lipitor 10 mg p.o. daily.  3. Phenergan 25 mg p.o. p.r.n. daily.  4. Multivitamin one p.o. daily.  5. Calcium with vitamin D.  6. Bronchodilator MDI.  The patient is not sure of the name.  7. Medication for rheumatoid  arthritis.  The patient also is not sure      of the name.  8. Doxycycline cough syrup prescribed 09/16/2006, now status post 4      days of treatment.   ALLERGIES:  BENADRYL this causes a rash.   SYSTEMS REVIEW:  As per HPI and chief complaint.   SOCIAL HISTORY:  The patient is married, resides with spouse.  Nonsmoker, nondrinker.  Has no history of drug abuse.  Has one daughter  who is alive and well.   FAMILY HISTORY:  The patient has three brothers, two of them have  hypertension, diabetes mellitus.  The health status of the middle  brother is not known.  She also has one sister whose health status is  unknown to her.  The patient's mother is still living at age 37 years;  she has hypertension, diabetes mellitus, and heart disease.  Her father  is deceased at age 34 years.  He had hypertension, diabetes mellitus,  and heart disease.   PHYSICAL EXAMINATION:  VITAL SIGNS:  Temperature 98.9, pulse 93 per  minute regular, respiratory rate 14, BP 144/74  mmHg, pulse oximeter 94%  on 2 liters of oxygen.  GENERAL:  The patient does not appear to be in obvious acute distress,  although she appears somewhat weak.  Alert, communicative, not short  of breath at rest.  HEENT:  No clinical pallor or jaundice.  No conjunctival injection.  Visible mucous membranes appear dry.  NECK:  Supple.  JVP not seen.  No palpable lymphadenopathy.  No palpable  goiter.  No carotid bruits.  CHEST:  Clinically clear to auscultation.  No wheezes, no crackles.  HEART:  Sounds 1 and 2 heard normal, regular, no murmurs.  ABDOMEN:  Flat, soft.  There is mild-to-moderate tenderness in the  epigastric region and periumbilically.  No guarding, no rebound.  Brisk  bowel sounds.  EXTREMITIES:  Lower extremity examination no pitting edema.  Palpable  peripheral pulses.  MUSCULOSKELETAL SYSTEM:  Not formally examined, although the patient  does appear to have spindling of the digits of her hands.  CENTRAL NERVOUS SYSTEM:  No focal neurologic deficit on gross  examination.   INVESTIGATIONS:  CBC WBC 12.9, hemoglobin 13.8, hematocrit 40.5,  platelets 131.  Electrolytes:  Sodium 137, potassium 3.4, chloride 100,  CO2 27, BUN 19, creatinine 0.79, glucose 160.  Urinalysis is negative.  Troponin I at point of care less than 0.05.  AST 42, ALT 27, alkaline  phosphatase 54, lipase 27.  Abdominal/chest x-ray dated 09/19/2006 shows  no active cardiopulmonary disease.  There was nonspecific bowel gas  pattern.  EKG dated 09/19/2006 shows sinus rhythm regular, normal axis,  101 per minute, old Q wave in V-1 and V-2, no acute ischemic changes.   ASSESSMENT AND PLAN:  1. Acute gastroenteritis, query etiology.  Likely viral. We shall      admit patient, institute bowel rest, intravenous fluid hydration,      follow electrolytes and correct as appropriate. In view of recent      antibiotic treatment, we shall check stool for C difficile toxin.  1. Febrile illness, query viral.   This patient has negative chest x-      ray and urinalysis.  For completeness, will do blood cultures.      Meanwhile, utilize p.r.n. analgesics.   1. Weight loss, i.e. approximately 14 pounds in the past 3 weeks'      associated with poor appetite.  The patient is status post normal  EGD 06/29/2006; and she is also status post benign abdominal/pelvic      CT scan findings 07/05/2006.  However, the patient may need      colonoscopy on an outpatient basis, when acute presenting phenomena      have resolved.   1. Rheumatoid arthritis.  No evidence of disease flare, at this time.   Further management will depend on clinical course.      Isidor Holts, M.D.  Electronically Signed     CO/MEDQ  D:  09/19/2006  T:  09/19/2006  Job:  [id number]   cc:   Brooke Bonito, M.D.  Fax: [phone]   Georgiana Spinner, M.D.  Fax: [phone]   Lemmie Evens, M.D.  Fax: [phone]

## 2010-12-12 NOTE — Discharge Summary (Signed)
NAMECARMEL, Donna Payne NO.:  [id number]   MEDICAL RECORD NO.:  [id number]          PATIENT TYPE:  INP   LOCATION:  1609                         FACILITY:  [facility]   PHYSICIAN:  Madaline Savage, MD        DATE OF BIRTH:  23-Aug-1945   DATE OF ADMISSION:  09/19/2006  DATE OF DISCHARGE:  09/25/2006                               DISCHARGE SUMMARY   PRIMARY CARE PHYSICIAN:  Brooke Bonito, M.D.   CONSULTATIONS:  Georgiana Spinner, M.D., gastroenterology.   DISCHARGE DIAGNOSES:  1. Ulcer of the esophagus.  2. Chronic abdominal pain.  3. Acute gastroenteritis.  4. Rheumatoid arthritis.  5. Genital herpes.  6. Dyslipidemia.   DISCHARGE MEDICATIONS:  1. Estrace 2 mg once daily.  2. Lipitor 10 mg once daily.  3. Asthmanex inhaler one to two puffs once daily.  4. Acyclovir 400 mg twice daily.  5. Calcium plus vitamin D 500 mg daily.  6. Diazepam 2.5 mg once daily as needed.  7. Tussionex 5 mL daily as needed.  8. Meclizine 25 mg four times daily as needed.  9. Carafate 1 g three times daily.  10.Amitriptyline 25 mg daily.   PROCEDURES:  1. Acute abdominal CT done September 19, 2006, which showed nonspecific      bowel gas pattern.  2. CT of abdomen and pelvis done on September 21, 2006, which showed no      acute findings in the abdomen and no acute findings in the pelvis.  3. X-ray of the left foot done on September 24, 2006, which showed no      acute findings.  4. Esophagogastroduodenoscopy done by Dr. Virginia Rochester on September 21, 2006,      which showed an ulcer in the esophagus which was biopsied.  The      biopsy results showed granulation tissue and fibrinous purulent      exudate consistent with ulcer bed.   HISTORY OF PRESENT ILLNESS:  For the full history and physical, see the  history and physical dictated by Dr. Isidor Holts on September 19, 2006.  In short, Ms. Athanas is a 65 year old lady with a history of  chronic abdominal pain who has been worked up by Dr. Virginia Rochester  and was worked  up at [employer] who comes in with fever, abdominal pain, vomiting,  diarrhea with questionable bloody stools for the last 5 days.  She has  been on doxycycline for an upper respiratory infection since September 16, 2006.  She was admitted for further evaluation and her  gastroenterologist, Dr. Virginia Rochester, was consulted.   HOSPITAL COURSE:  Problem 1.  CHRONIC ABDOMINAL PAIN:  Dr. Virginia Rochester saw the  patient.  She was having nausea and vomiting from acute gastroenteritis.  Dr. Virginia Rochester performed an EGD which showed esophageal ulcer.  This is most  likely an pill-related ulcer secondary to the doxycycline which she was  taking prior to admission for her upper respiratory infection.  She has  been put on Carafate for it and since then, her abdominal pain has  improved almost back to her  baseline.   Problem 2.  ACUTE GASTROENTERITIS:  She had some symptoms of bowel pain,  nausea and vomiting on admission which has resolved at this point in  time.   Problem 3.  DEPRESSION:  She was started on amitriptyline while in the  hospital.  We will have to titrate this dose as an outpatient.   Problem 4.  LEFT FOOT PAIN:  She has had left foot pain off and on in  the past.  She complained of left foot pain while in the hospital and we  did an x-ray of her foot which was negative.  Most likely, this is  plantar fasciitis for which I have asked her to follow up with her  rheumatologist, Dr. Jimmy Footman.   Problem 5.  FEVER:  On admission, she had fever most likely from an  upper respiratory viral infection.  Her x-ray of chest was negative.  Her urinalysis was negative.  Blood cultures were negative.  Since then,  she has been afebrile and now she is back to her baseline.   Problem 6.  RHEUMATOID ARTHRITIS:  Will continue her current  medications.   CONDITION ON DISCHARGE:  She is now being discharged home in stable  condition.   FOLLOW UP:  She will follow up with Dr. Juleen China in 1-2 weeks.  She  is also  asked to keep her appointment with her regular gastroenterologist and  rheumatologist.      Madaline Savage, MD  Electronically Signed     PKN/MEDQ  D:  09/25/2006  T:  09/25/2006  Job:  [id number]

## 2010-12-12 NOTE — Op Note (Signed)
   Donna Payne, Donna Payne                       ACCOUNT NO.:  [id number]   MEDICAL RECORD NO.:  [id number]                   PATIENT TYPE:  AMB   LOCATION:  ENDO                                 FACILITY:  [facility]   PHYSICIAN:  Georgiana Spinner, M.D.                 DATE OF BIRTH:  [birth year]   DATE OF PROCEDURE:  DATE OF DISCHARGE:  03/02/2002                                 OPERATIVE REPORT   PROCEDURE:  Upper endoscopy with Savary dilation.   INDICATION:  Dysphagia.   ANESTHESIA:  Demerol 60 mg, Versed 7 mg.   DESCRIPTION OF PROCEDURE:  With the patient mildly sedated in the left  lateral decubitus position, the Olympus videoscopic endoscope was inserted  in the mouth and passed under direct vision through the esophagus -- which  appeared normal into the stomach.  The fundus, body, antrum, duodenal bulb  and second portion of the duodenum all appeared normal.  From this point the  endoscope was slowly withdrawn, taking circumferential views of the entire  duodenal mucosa until the endoscope pulled back into the stomach and placed  in retroflexion to view the stomach from below.  The endoscope was then  straightened and the guide wire was passed.  The endoscope was removed,  taking circumferential views of the remaining gastric and esophageal mucosa.  Subsequently over the guide wire was passed  a 17-Savary dilator very  easily; once passed, it was removed along with the guide wire.  The  endoscope was reinserted and no other lesions noted in the gastric and  esophageal areas seen.  The patient's vital signs and pulse oximetry  remained stable.  The patient tolerated the procedure well without apparent  complications.   FINDINGS:  Essentially negative endoscopic examination with Savary dilation  to 17 mm.   PLAN:  Await clinical response.  Have patient follow up with me as an  outpatient.                                               Georgiana Spinner, M.D.    GMO/MEDQ  D:   03/02/2002  T:  03/04/2002  Job:  [id number]

## 2010-12-12 NOTE — Op Note (Signed)
NAMECRYSTIE, YANKO NO.:  [id number]   MEDICAL RECORD NO.:  [id number]          PATIENT TYPE:  INP   LOCATION:  1609                         FACILITY:  [facility]   PHYSICIAN:  Georgiana Spinner, M.D.    DATE OF BIRTH:  Mar 26, 1946   DATE OF PROCEDURE:  DATE OF DISCHARGE:                               OPERATIVE REPORT   PROCEDURE:  Upper endoscopy with biopsy.   INDICATIONS:  Abdominal pain.   ANESTHESIA:  Fentanyl 50 mcg, Versed 6 mg.   PROCEDURE:  With the patient mildly sedated in the left lateral  decubitus position, the Pentax videoscopic endoscope was inserted in the  mouth and passed under direct vision through the esophagus, which  appeared normal until we reached about the mid esophagus and there  appeared to be an ulcer with copious amounts whitish exudate and a red  base.  This was photographed and we then biopsied around the perimeter  of this subsequently.  We advanced to the distal esophagus, which  appeared normal with a small hiatal hernia.  The endoscope was advanced  into the stomach.  Fundus, body, antrum, duodenal bulb, second portion  of the duodenum were visualized.  From this point the endoscope was  slowly withdrawn taking circumferential views of the duodenal mucosa  until the endoscope had been pulled back in the stomach, placed in  retroflexion to view the stomach from below.  The endoscope was then  straightened and erythematous folds were biopsied.  The endoscope was  withdrawn taking circumferential views of the remaining gastric and  esophageal mucosa.  The patient's vital signs and pulse oximeter  remained stable.  The patient tolerated the procedure well without  apparent complications.   FINDINGS:  1. Ulcer of esophagus, biopsied.  2. Erythematous folds, biopsied.   Await biopsy report.  Will start the patient on Diflucan, although I am  not sure this is fungal.  It may be pill-related.  It could be a viral  ulcer as well, and  biopsies have been obtained.  Will start the patient  on Carafate and schedule CT enterography.           ______________________________  Georgiana Spinner, M.D.     GMO/MEDQ  D:  09/21/2006  T:  09/21/2006  Job:  [id number]

## 2010-12-12 NOTE — H&P (Signed)
[facility]  Patient:    Donna Payne, Donna Payne Visit Number: [id number] MRN: [id number]          Service Type: EMS Location: ED Attending Physician:  Shelba Flake Dictated by:   Guy Sandifer Arleta Creek, M.D. Admit Date:  12/12/2001 Discharge Date: 12/12/2001                           History and Physical  CHIEF COMPLAINT:              Pelvic pain.  HISTORY OF PRESENT ILLNESS:   This patient is a 65 year old married black female, G 1, P 1, status post total abdominal hysterectomy, and now bilateral salpingo-oophorectomy, who has known pelvic/abdominal adhesions from previous surgeries.  She is complaining of increasing left lower quadrant pain.  This has become progressively worse and required a trip to the emergency room for narcotics within the last month or two.  The pain waxes and wanes.  She has normal bowel movements, and no nausea or vomiting.  An abdominal pelvic CAT scan was done on Dec 06, 2001.  This was a negative CAT scan of the abdomen. There is an approximately 2.5 cm cystic collection in the left pelvis.  This is essentially unchanged from a CAT scan done on January 23, 1999, which noted a 3.1 cm fluid collection in the left adnexa.  At the time of laparoscopy on March 29, 1992, after taking down of multiple omental adhesions, a thorough inspection of the pelvic side walls was negative for any evidence of residual ovary or other abnormal growths.  The probability of recurrent adhesions and secondary pain was discussed with the patient.  Because of the worsening nature of the pain requiring narcotic therapy, the patient requests a surgical correction.  A laparoscopy is discussed.  The potential risks and complications have been reviewed.  The possibility of recurrent pelvic pain and recurrent adhesion formation has also been emphasized.  PAST MEDICAL HISTORY:         1. Genital herpes.                               2. Ulcer disease in  the past.                               3. Hepatitis approximately 30 years ago.                               4. Arthritis.  PAST SURGICAL HISTORY:        1. Ovarian cystectomy x 3 in the 1980s.                               2. Gum surgery in the 1980s.                               3. Total abdominal hysterectomy with possible                               removal of one ovary in 1988.  4. Laparotomy with probable removal of the                               remaining ovary in 1990.                               5. Laparoscopy with extensive adhesiolysis                               in 1993.  CURRENT MEDICATIONS:          1. Estrace 1 mg q.d.                               2. Flexeril q.d.                               3. Vicodin p.r.n.                               4. Methotrexate.                               5. Folate.  ALLERGIES:                    No known drug allergies.  FAMILY HISTORY:               Positive for adult onset diabetes mellitus. Cancer in a maternal aunt.  Chronic hypertension.  OBSTETRIC HISTORY:            A vaginal delivery x 1.  SOCIAL HISTORY:               The patient denies tobacco, alcohol, or drug abuse.  REVIEW OF SYSTEMS:            Negative except as above.  PHYSICAL EXAMINATION  VITAL SIGNS:                  Height 5 feet 5 inches, weight 137 pounds, blood pressure 110/60.  HEENT/NECK:                   Without thyromegaly.  LUNGS:                        Clear to auscultation.  HEART:                        A regular rate and rhythm.  BACK:                         Without CVA tenderness.  BREASTS:                      Without mass, retraction, or nipple discharge.  ABDOMEN:                      Soft, nontender, without masses.  PELVIC:                       Diffuse tenderness in  the vaginal cuff.  No palpable masses.  The examination is compromised by the patients voluntary guarding.  EXTREMITIES:                   Grossly within normal limits.  NEUROLOGIC:                   Grossly within normal limits.  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  PLAN:                         For an open laparoscopy. Dictated by:   Guy Sandifer Arleta Creek, M.D. Attending Physician:  Shelba Flake DD:  12/26/01 TD:  12/26/01 Job: [id number]

## 2010-12-12 NOTE — Op Note (Signed)
[facility]  Patient:    Donna Payne Visit Number: [id number] MRN: [id number]          Service Type: DSU Location: [facility] Attending Physician:  Carlean Purl Dictated by:   Kristine Garbe Ezzard Standing, M.D. Proc. 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 Compensatory turbinate hypertrophy on the left side.  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  Compensatory turbinate hypertrophy on the left side.  OPERATION PERFORMED:  Septoplasty with bilateral inferior turbinate reductions.  SURGEON:  Kristine Garbe. Ezzard Standing, M.D.  ANESTHESIA:  General endotracheal.  COMPLICATIONS:  None.  INDICATIONS FOR PROCEDURE:  Donna Payne is a 65 year old female who presents because of nasal obstruction of the right side much worse than the left.  She also has some episodes of difficulty catching her breath which seem to be more of a panic type attack or laryngospasm.  Concerning the nasal obstruction, she has a significant septal deformity to the right with a very narrowed right nasal passageway compared to the left which contributes to her nasal obstruction and she has compensatory left turbinate hypertrophy.  The patient was taken to the operating room at this time for septoplasty and turbinate reductions.  DESCRIPTION OF PROCEDURE:  After adequate endotracheal anesthesia, the patient received 1 gm Ancef IV preoperatively.  The nose was prepped with Betadine solution and draped out with sterile towels and the septum turbinates were injected with Xylocaine with epinephrine for hemostasis.  A hemitransfixion incision was made along the septum on the right side.  Mucoperichondrial and mucoperiosteal flaps were elevated posteriorly.  Along the maxillary crest there was a sharp bony spur  protruding to the right inferior turbinate along with some of the cartilage sitting on the crest.  This bony spur and cartilage was removed more posteriorly.  The bony septum protruded to the right and this likewise was removed after elevating mucoperiosteal flaps bilaterally.  This completed and septoplasty portion of the procedure and the remaining septum was relatively straight although the cartilage portion of the septum was very flimsy.  The hemitransfixion incision was closed with interrupted 4-0 chromic sutures and the septum was basted with a 4-0 chromic suture.  Following this, the left inferior turbinate reduction was performed.  The left turbinate was very large and an inferior one third of the turbinate was amputated with scissors and then suction cautery was used to cauterize the remaining mucosal edge and posterior turbinate.  On the right side the turbinate was smaller as the septum had protruded into the right inferior turbinate.  Using the suction cautery, some of the anterior and posterior turbinate mucosa was cauterized. This completed the procedure.  The nose was then packed with Telfa soaked in bacitracin ointment.  The patient was awakened from anesthesia and transferred to the recovery room postoperatively doing well.  DISPOSITION:  The patient was discharged home later this morning on Tylenol, Vicodin and/or Darvocet for pain.  She will follow up in my office tomorrow morning to have her nasal packs removed. Dictated by:   Kristine Garbe Ezzard Standing, M.D. Attending Physician:  Carlean Purl DD:  05/03/01 TD:  05/03/01 Job: [id number]

## 2010-12-12 NOTE — Op Note (Signed)
NAMELEONIE, AMACHER NO.:  [id number]   MEDICAL RECORD NO.:  [id number]          PATIENT TYPE:  AMB   LOCATION:  DAY                          FACILITY:  [facility]   PHYSICIAN:  Lebron Conners, M.D.   DATE OF BIRTH:  May 13, 1946   DATE OF PROCEDURE:  10/12/2005  DATE OF DISCHARGE:                                 OPERATIVE REPORT   PREOPERATIVE DIAGNOSIS:  Chronic abdominal pain of uncertain etiology.   POSTOPERATIVE DIAGNOSIS:  Chronic abdominal pain of uncertain etiology.   OPERATION:  Laparoscopy, lysis of adhesions, cholecystectomy, operative  cholangiogram.   SURGEON:  Lebron Conners, MD.   ANESTHESIA:  General and local.   PROCEDURE:  After the patient was monitored and asleep and had routine  preparation and draping of the abdomen, I infiltrated local anesthetic just  above the umbilicus and made a 2 cm incision vertically and then a 2 cm  vertical incision in the midline fascia. I bluntly entered the peritoneal  cavity and assured good access. I placed a #0 Vicryl pursestring suture in  the fascia and then secured a Hassan cannula and inflated the abdomen with  carbon dioxide. After getting good pneumoperitoneum, I examined the  abdominal contents. There were some adhesions of omentum to the undersurface  of the gallbladder and it otherwise had an unremarkable appearance. The  liver had an unremarkable appearance. The stomach and duodenum likewise  appeared normal. In the lower abdomen, there were adhesions tethering some  fat and small bowel to the lower midline and some in the right lower  quadrant tethering some small bowel in the cecum to the anterior abdominal  wall. After placement of 3 additional laparoscopic ports, I used the  scissors and cautery to take down the adhesions which were tethering the  bowel. Bleeding was not a problem and I saw no evidence of any bowel injury.  There was no evidence of any bowel dilatation or obstruction and no  masses  were seen in the pelvis. I then turned attention to the gallbladder and had  the patient positioned head-up foot down and tilted to the left. I grasped  the fundus of the gallbladder with a ratcheted grasper and elevated it  toward the right shoulder and then took down the adhesions until I could see  the infundibulum. I pulled the infundibulum laterally and dissected down  until I saw the cystic duct clearly emerging from the infundibulum. I saw  the cystic artery crossing anterior to that and clipped the cystic artery  with three clips and divided between the two closest to the gallbladder. I  then dissected out the cystic duct circumferentially and placed a clip as it  emerged from the infundibulum. I made a hole in it and inserted a Cook  capsule cholangiogram catheter and shot a fluoroscopic cholangiogram showing  normal size ducts, free drainage into the duodenum and no evident  obstruction, or stones. The intrahepatic radicals did not fill out very well  because of free drainage into the duodenum but there were seen to be of  normal size and what I could see  appeared normal. I then resumed  laparoscopic examination and removed the clip holding the [facility] catheter,  clipped the distal cystic duct with three clips and divided it at the site  of the incision for cholangiogram. I then removed the gallbladder from the  liver utilizing the cautery and gaining hemostasis with the cautery. In the  detachment of the gallbladder, I made one small hole in it so I placed it in  a plastic pouch and held it above the liver. I copiously irrigated the right  upper quadrant to dilute the bile and I removed all the irrigant. I then  checked the area of dissection of adhesions and saw no evidence of any  bleeding or leakage of bowel contents. The sponge, needle and instrument  counts were correct. I removed the two lateral ports under direct view. I  removed  the gallbladder in its plastic pouch  from the abdominal incision and tied  the pursestring suture. I then allowed the carbon dioxide to escape and  removed the epigastric port. I closed all skin incisions with intracuticular  4-0 Vicryl and Steri-Strips. The patient was stable throughout the  procedure.      Lebron Conners, M.D.  Electronically Signed     WB/MEDQ  D:  10/12/2005  T:  10/13/2005  Job:  [id number]   cc:   Brooke Bonito, M.D.  Fax: [phone]

## 2010-12-12 NOTE — Op Note (Signed)
NAMEGeradine Payne               ACCOUNT NO.:  [id number]   MEDICAL RECORD NO.:  [id number]          PATIENT TYPE:  AMB   LOCATION:  ENDO                         FACILITY:  [facility]   PHYSICIAN:  Georgiana Spinner, M.D.    DATE OF BIRTH:  September 02, 1945   DATE OF PROCEDURE:  06/29/2006  DATE OF DISCHARGE:                               OPERATIVE REPORT   PROCEDURE:  Upper endoscopy.   INDICATIONS:  Abdominal pain.   ANESTHESIA:  Fentanyl 90 mcg, Versed 6 mg.   PROCEDURE:  With the patient mildly sedated in the left lateral  decubitus position, the Olympus videoscopic endoscope was inserted in  the mouth and passed under direct vision through the esophagus, which  appeared normal, and into the stomach.  The fundus, body, antrum,  duodenal bulb, and second portion of the duodenum were visualized. From  this point, the endoscope was slowly withdrawn taking circumferential  views of the duodenal mucosa until the endoscope had been pulled back  into the stomach and placed in retroflexion to view the stomach from  below.  The endoscope was then straightened and withdrawn taking  circumferential views of the gastric and esophageal mucosa. The  patient's vital signs and pulse oximeter remained stable.  The patient  tolerated the procedure well without apparent complications.   FINDINGS:  Unremarkable examination.   PLAN:  Consider CT enterography to evaluate further.           ______________________________  Georgiana Spinner, M.D.     GMO/MEDQ  D:  06/29/2006  T:  06/29/2006  Job:  [id number]
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
## 2011-05-08 LAB — DIFFERENTIAL
Lymphocytes Relative: 46
Lymphs Abs: 2.7
Monocytes Relative: 8
Neutro Abs: 2.6
Neutrophils Relative %: 45

## 2011-05-08 LAB — COMPREHENSIVE METABOLIC PANEL
Albumin: 3.7
BUN: 13
Calcium: 9.7
Glucose, Bld: 106 — ABNORMAL HIGH
Total Protein: 6.7

## 2011-05-08 LAB — URINALYSIS, ROUTINE W REFLEX MICROSCOPIC
Glucose, UA: NEGATIVE
Protein, ur: NEGATIVE
Specific Gravity, Urine: 1.027
pH: 6.5

## 2011-05-08 LAB — CBC
HCT: 43.1
Hemoglobin: 14.5
MCHC: 33.7
Platelets: 203
RDW: 13.8

## 2011-05-08 LAB — POCT CARDIAC MARKERS
CKMB, poc: <1 — ABNORMAL LOW
Myoglobin, poc: 55.4
Operator id: 4533
Troponin i, poc: <0.05

## 2011-05-11 ENCOUNTER — Ambulatory Visit: Payer: Medicare Other | Admitting: Physical Medicine and Rehabilitation

## 2011-05-11 LAB — DIFFERENTIAL
Eosinophils Absolute: 0.1
Eosinophils Relative: 1
Lymphs Abs: 2.4
Monocytes Absolute: 0.5
Monocytes Relative: 10

## 2011-05-11 LAB — URINALYSIS, ROUTINE W REFLEX MICROSCOPIC
Glucose, UA: NEGATIVE
Hgb urine dipstick: NEGATIVE
Protein, ur: NEGATIVE

## 2011-05-11 LAB — CBC
HCT: 36.5
Hemoglobin: 12.4
MCV: 90.4
Platelets: 214
WBC: 5.2

## 2011-05-22 ENCOUNTER — Encounter
Payer: Medicare Other | Attending: Physical Medicine and Rehabilitation | Admitting: Physical Medicine and Rehabilitation

## 2011-06-09 ENCOUNTER — Ambulatory Visit: Payer: Medicare Other | Admitting: Gastroenterology

## 2011-06-23 ENCOUNTER — Ambulatory Visit: Payer: Medicare Other | Admitting: Gastroenterology

## 2011-06-25 ENCOUNTER — Encounter: Payer: Self-pay | Admitting: Gastroenterology

## 2011-06-25 ENCOUNTER — Ambulatory Visit (INDEPENDENT_AMBULATORY_CARE_PROVIDER_SITE_OTHER): Payer: Medicare Other | Admitting: Gastroenterology

## 2011-06-25 VITALS — BP 128/80 | HR 60 | Ht 65.0 in | Wt 121.2 lb

## 2011-06-25 DIAGNOSIS — R197 Diarrhea, unspecified: Secondary | ICD-10-CM

## 2011-06-25 MED ORDER — METRONIDAZOLE 500 MG PO TABS: 500.0000 mg | ORAL_TABLET | Freq: Two times a day (BID) | ORAL | Status: AC

## 2011-06-25 NOTE — Patient Instructions (Addendum)
Your antibiotic metronidazole has been sent to your pharmacy.  Stay on a low fat diet. Brochure given. Please continue your follow up Gastroenterology care with Dr. Elnoria Howard in 2 weeks. Their office phone number is [phone]. cc: Jeani Hawking, MD       Darci Needle, MD       Alfredo Martinez, MD

## 2011-06-25 NOTE — Progress Notes (Signed)
History of Present Illness: This is a 65 year old female with a four-week history of urgent, watery, nonbloody postprandial diarrhea. She was treated with nitrofurantoin for urinary symptoms in September and another antibiotic for 2 weeks in October for a dental problem. Following this her diarrhea began. She is a patient of Dr. Jeani Hawking and underwent colonoscopy by him in 2011 that revealed hemorrhoids by patient's report however I do not have the colonoscopy report. Stool for C. difficile was apparently sent from Dr. Mina Marble office however the results are unavailable. Denies weight loss, abdominal pain, constipation, change in stool caliber, melena, hematochezia, nausea, vomiting, dysphagia, reflux symptoms, chest pain.  Allergies  Allergen Reactions  . Betadine (Povidone Iodine)   . Codeine    Outpatient Prescriptions Prior to Visit  Medication Sig Dispense Refill  . acyclovir (ZOVIRAX) 400 MG tablet Take 400 mg by mouth every 4 (four) hours while awake.        Marland Kitchen amLODipine-valsartan (EXFORGE) 5-160 MG per tablet Take 1 tablet by mouth daily.        . Calcium Carbonate-Vitamin D (CALCIUM + D PO) Take 1 tablet by mouth daily.        Marland Kitchen darifenacin (ENABLEX) 7.5 MG 24 hr tablet Take 7.5 mg by mouth daily.        . Fluticasone-Salmeterol (ADVAIR) 250-50 MCG/DOSE AEPB Inhale 1 puff into the lungs every 12 (twelve) hours.        . hydrOXYzine (ATARAX/VISTARIL) 10 MG tablet Take 10 mg by mouth daily.        Marland Kitchen leflunomide (ARAVA) 20 MG tablet Take 20 mg by mouth daily.        . Multiple Vitamin (MULTIVITAMIN) tablet Take 1 tablet by mouth daily.        . nitrofurantoin (MACRODANTIN) 100 MG capsule Take 100 mg by mouth 2 (two) times daily.        . pentosan polysulfate (ELMIRON) 100 MG capsule Take 100 mg by mouth 2 (two) times daily.        . Vitamin D, Ergocalciferol, (DRISDOL) 50000 UNITS CAPS Take 50,000 Units by mouth.        . hydrOXYzine (VISTARIL) 25 MG capsule Take 25 mg by mouth  daily.         Past Medical History  Diagnosis Date  . Sleep apnea   . Anxiety   . Arthritis   . Asthma   . Bronchitis   . Depression   . GERD (gastroesophageal reflux disease)   . Hypercholesterolemia   . Murmur   . Peptic ulcer   . Rheumatoid arthritis   . Venous thrombosis   . Hypertension   . Genital herpes    Past Surgical History  Procedure Date  . Eye surgery   . Foot surgery   . Cholecystectomy   . Vaginal hysterectomy   . Nose surgery   . Mouth surgery    History   Social History  . Marital Status: Divorced    Spouse Name: N/A    Number of Children: N/A  . Years of Education: N/A   Occupational History  . Retired    Social History Main Topics  . Smoking status: Former Games developer  . Smokeless tobacco: Never Used  . Alcohol Use: No  . Drug Use: No  . Sexually Active: None   Other Topics Concern  . None   Social History Narrative  . None   Family History  Problem Relation Age of Onset  . Prostate cancer Father   .  Diabetes Father   . Diabetes Mother   . Heart disease Mother   . Kidney disease Mother   . Colon polyps Brother   . Diabetes Brother   . Heart disease Brother      Review of Systems: Pertinent positive and negative review of systems were noted in the above HPI section. All other review of systems were otherwise negative.   Physical Exam: General: Well developed , well nourished, no acute distress Head: Normocephalic and atraumatic Eyes:  sclerae anicteric, EOMI Ears: Normal auditory acuity Mouth: No deformity or lesions Neck: Supple, no masses or thyromegaly Lungs: Clear throughout to auscultation Heart: Regular rate and rhythm; no murmurs, rubs or bruits Abdomen: Soft, mild LUQ tenderness without rebound or guarding and non distended. No masses, hepatosplenomegaly or hernias noted. Normal Bowel sounds Rectal: deferred  Musculoskeletal: Symmetrical with no gross deformities  Skin: No lesions on visible extremities Pulses:   Normal pulses noted Extremities: No clubbing, cyanosis, edema or deformities noted Neurological: Alert oriented x 4, grossly nonfocal Cervical Nodes:  No significant cervical adenopathy Inguinal Nodes: No significant inguinal adenopathy Psychological:  Alert and cooperative. Normal mood and affect  Assessment and Recommendations:  1. Diarrhea. Rule out infectious etiologies, IBD and other disorders. Begin metronidazole for possible C. difficile. The patient would like to return to Dr. Elnoria Howard for all ongoing GI care and I recommended she schedule a followup appointment with him in 2 weeks.

## 2011-07-17 ENCOUNTER — Telehealth: Payer: Self-pay | Admitting: Gastroenterology

## 2011-07-17 NOTE — Telephone Encounter (Signed)
Patient is advised that she should have her primary care MD call and straighten out with Dr Rolla Etienne office that they sent her to another GI on accident.  I have advised her to contact her primary care and Dr Sherron Monday

## 2011-09-09 ENCOUNTER — Ambulatory Visit: Payer: Medicare Other | Attending: Internal Medicine

## 2011-09-09 DIAGNOSIS — IMO0001 Reserved for inherently not codable concepts without codable children: Secondary | ICD-10-CM | POA: Insufficient documentation

## 2011-09-09 DIAGNOSIS — M25559 Pain in unspecified hip: Secondary | ICD-10-CM | POA: Insufficient documentation

## 2011-09-09 DIAGNOSIS — M545 Low back pain, unspecified: Secondary | ICD-10-CM | POA: Insufficient documentation

## 2011-09-11 ENCOUNTER — Ambulatory Visit: Payer: Medicare Other

## 2011-09-15 ENCOUNTER — Ambulatory Visit: Payer: Medicare Other

## 2011-09-16 ENCOUNTER — Ambulatory Visit: Payer: Medicare Other | Admitting: Physical Therapy

## 2011-09-18 ENCOUNTER — Ambulatory Visit: Payer: Medicare Other

## 2011-09-24 ENCOUNTER — Ambulatory Visit: Payer: Medicare Other | Admitting: Physical Therapy

## 2011-09-25 ENCOUNTER — Ambulatory Visit: Payer: Medicare Other | Attending: Internal Medicine | Admitting: Physical Therapy

## 2011-09-25 DIAGNOSIS — M25559 Pain in unspecified hip: Secondary | ICD-10-CM | POA: Insufficient documentation

## 2011-09-25 DIAGNOSIS — IMO0001 Reserved for inherently not codable concepts without codable children: Secondary | ICD-10-CM | POA: Insufficient documentation

## 2011-09-25 DIAGNOSIS — M545 Low back pain, unspecified: Secondary | ICD-10-CM | POA: Insufficient documentation

## 2011-09-29 ENCOUNTER — Encounter: Payer: Medicare Other | Admitting: Physical Therapy

## 2011-09-30 ENCOUNTER — Ambulatory Visit: Payer: Medicare Other | Admitting: Physical Therapy

## 2011-10-09 ENCOUNTER — Ambulatory Visit: Payer: Medicare Other | Admitting: Physical Therapy

## 2011-11-10 ENCOUNTER — Observation Stay (HOSPITAL_COMMUNITY)
Admission: EM | Admit: 2011-11-10 | Discharge: 2011-11-10 | Disposition: A | Discharge status: Home / Self Care | Payer: Medicare Other | Attending: Internal Medicine | Admitting: Internal Medicine

## 2011-11-10 ENCOUNTER — Encounter (HOSPITAL_COMMUNITY): Payer: Self-pay | Admitting: Emergency Medicine

## 2011-11-10 ENCOUNTER — Emergency Department (HOSPITAL_COMMUNITY): Payer: Medicare Other

## 2011-11-10 ENCOUNTER — Observation Stay (HOSPITAL_COMMUNITY): Payer: Medicare Other

## 2011-11-10 DIAGNOSIS — M069 Rheumatoid arthritis, unspecified: Secondary | ICD-10-CM | POA: Diagnosis present

## 2011-11-10 DIAGNOSIS — K219 Gastro-esophageal reflux disease without esophagitis: Secondary | ICD-10-CM | POA: Insufficient documentation

## 2011-11-10 DIAGNOSIS — M79609 Pain in unspecified limb: Secondary | ICD-10-CM

## 2011-11-10 DIAGNOSIS — M7989 Other specified soft tissue disorders: Secondary | ICD-10-CM

## 2011-11-10 DIAGNOSIS — R079 Chest pain, unspecified: Secondary | ICD-10-CM | POA: Diagnosis present

## 2011-11-10 DIAGNOSIS — R42 Dizziness and giddiness: Secondary | ICD-10-CM | POA: Insufficient documentation

## 2011-11-10 DIAGNOSIS — R0789 Other chest pain: Principal | ICD-10-CM | POA: Insufficient documentation

## 2011-11-10 DIAGNOSIS — I10 Essential (primary) hypertension: Secondary | ICD-10-CM | POA: Diagnosis present

## 2011-11-10 DIAGNOSIS — R269 Unspecified abnormalities of gait and mobility: Secondary | ICD-10-CM | POA: Insufficient documentation

## 2011-11-10 DIAGNOSIS — M940 Chondrocostal junction syndrome [Tietze]: Secondary | ICD-10-CM | POA: Insufficient documentation

## 2011-11-10 DIAGNOSIS — R634 Abnormal weight loss: Secondary | ICD-10-CM | POA: Insufficient documentation

## 2011-11-10 DIAGNOSIS — E785 Hyperlipidemia, unspecified: Secondary | ICD-10-CM | POA: Insufficient documentation

## 2011-11-10 HISTORY — PX: DOPPLER ECHOCARDIOGRAPHY: SHX263

## 2011-11-10 LAB — COMPREHENSIVE METABOLIC PANEL
AST: 28 U/L (ref 0–37)
Albumin: 3.6 g/dL (ref 3.5–5.2)
BUN: 14 mg/dL (ref 6–23)
Calcium: 9.5 mg/dL (ref 8.4–10.5)
Creatinine, Ser: 0.8 mg/dL (ref 0.50–1.10)

## 2011-11-10 LAB — CBC
MCH: 30.2 pg (ref 26.0–34.0)
MCHC: 32.3 g/dL (ref 30.0–36.0)
MCV: 93.5 fL (ref 78.0–100.0)
Platelets: 205 10*3/uL (ref 150–400)
RDW: 13.6 % (ref 11.5–15.5)
WBC: 6.3 10*3/uL (ref 4.0–10.5)

## 2011-11-10 LAB — DIFFERENTIAL
Basophils Absolute: 0 10*3/uL (ref 0.0–0.1)
Basophils Relative: 0 % (ref 0–1)
Eosinophils Absolute: 0.1 10*3/uL (ref 0.0–0.7)
Eosinophils Relative: 1 % (ref 0–5)
Monocytes Absolute: 0.7 10*3/uL (ref 0.1–1.0)

## 2011-11-10 LAB — URINALYSIS, ROUTINE W REFLEX MICROSCOPIC
Bilirubin Urine: NEGATIVE
Hgb urine dipstick: NEGATIVE
Specific Gravity, Urine: 1.021 (ref 1.005–1.030)
Urobilinogen, UA: 0.2 mg/dL (ref 0.0–1.0)

## 2011-11-10 LAB — CARDIAC PANEL(CRET KIN+CKTOT+MB+TROPI): Total CK: 86 U/L (ref 7–177)

## 2011-11-10 LAB — TROPONIN I: Troponin I: <0.3 ng/mL (ref <0.30)

## 2011-11-10 LAB — D-DIMER, QUANTITATIVE: D-Dimer, Quant: 0.63 ug/mL-FEU — ABNORMAL HIGH (ref 0.00–0.48)

## 2011-11-10 LAB — LIPASE, BLOOD: Lipase: 54 U/L (ref 11–59)

## 2011-11-10 MED ORDER — DOCUSATE SODIUM 100 MG PO CAPS
100.0000 mg | ORAL_CAPSULE | Freq: Two times a day (BID) | ORAL | Status: DC
  Filled 2011-11-10 (×2): qty 1

## 2011-11-10 MED ORDER — MORPHINE SULFATE 4 MG/ML IJ SOLN: 4.0000 mg | INTRAMUSCULAR | Status: DC | PRN

## 2011-11-10 MED ORDER — ONDANSETRON HCL 4 MG/2ML IJ SOLN
4.0000 mg | Freq: Once | INTRAMUSCULAR | Status: AC
  Administered 2011-11-10: 4 mg via INTRAVENOUS
  Filled 2011-11-10: qty 2

## 2011-11-10 MED ORDER — SODIUM CHLORIDE 0.9 % IV SOLN: INTRAVENOUS | Status: DC

## 2011-11-10 MED ORDER — ASPIRIN 81 MG PO CHEW
324.0000 mg | CHEWABLE_TABLET | Freq: Once | ORAL | Status: AC
Start: 2011-11-10 — End: 2011-11-10
  Administered 2011-11-10: 324 mg via ORAL
  Filled 2011-11-10: qty 4

## 2011-11-10 MED ORDER — MORPHINE SULFATE 4 MG/ML IJ SOLN
4.0000 mg | Freq: Once | INTRAMUSCULAR | Status: AC
  Administered 2011-11-10: 4 mg via INTRAVENOUS
  Filled 2011-11-10: qty 1

## 2011-11-10 MED ORDER — SODIUM CHLORIDE 0.9 % IV SOLN: Freq: Once | INTRAVENOUS | Status: DC

## 2011-11-10 MED ORDER — NITROGLYCERIN 0.4 MG SL SUBL: 0.4000 mg | SUBLINGUAL_TABLET | SUBLINGUAL | Status: DC | PRN

## 2011-11-10 MED ORDER — SODIUM CHLORIDE 0.9 % IJ SOLN
3.0000 mL | Freq: Two times a day (BID) | INTRAMUSCULAR | Status: DC
  Administered 2011-11-10: 3 mL via INTRAVENOUS

## 2011-11-10 MED ORDER — LEFLUNOMIDE 20 MG PO TABS: 20.0000 mg | ORAL_TABLET | Freq: Every day | ORAL | Status: DC

## 2011-11-10 MED ORDER — NITROGLYCERIN 0.4 MG SL SUBL: 0.4000 mg | SUBLINGUAL_TABLET | SUBLINGUAL | Status: AC | PRN

## 2011-11-10 MED ORDER — ONDANSETRON HCL 4 MG/2ML IJ SOLN
4.0000 mg | Freq: Four times a day (QID) | INTRAMUSCULAR | Status: DC | PRN
  Administered 2011-11-10: 4 mg via INTRAVENOUS
  Filled 2011-11-10: qty 2

## 2011-11-10 MED ORDER — NITROGLYCERIN 0.4 MG SL SUBL
0.4000 mg | SUBLINGUAL_TABLET | SUBLINGUAL | Status: DC | PRN
  Administered 2011-11-10 (×3): 0.4 mg via SUBLINGUAL
  Filled 2011-11-10 (×2): qty 25

## 2011-11-10 MED ORDER — SODIUM CHLORIDE 0.9 % IJ SOLN: 3.0000 mL | INTRAMUSCULAR | Status: DC | PRN

## 2011-11-10 MED ORDER — ASPIRIN 325 MG PO TBEC: 325.0000 mg | DELAYED_RELEASE_TABLET | Freq: Every day | ORAL | Status: AC

## 2011-11-10 MED ORDER — PREGABALIN 50 MG PO CAPS
50.0000 mg | ORAL_CAPSULE | Freq: Every day | ORAL | Status: DC
  Administered 2011-11-10: 50 mg via ORAL
  Filled 2011-11-10: qty 1

## 2011-11-10 MED ORDER — METOPROLOL SUCCINATE ER 25 MG PO TB24
25.0000 mg | ORAL_TABLET | Freq: Every day | ORAL | Status: DC
  Administered 2011-11-10: 25 mg via ORAL
  Filled 2011-11-10: qty 1

## 2011-11-10 MED ORDER — IOHEXOL 300 MG/ML  SOLN: 80.0000 mL | Freq: Once | INTRAMUSCULAR | Status: DC | PRN

## 2011-11-10 MED ORDER — PENTOSAN POLYSULFATE SODIUM 100 MG PO CAPS
100.0000 mg | ORAL_CAPSULE | Freq: Two times a day (BID) | ORAL | Status: DC
  Administered 2011-11-10: 100 mg via ORAL
  Filled 2011-11-10 (×2): qty 1

## 2011-11-10 MED ORDER — SENNA 8.6 MG PO TABS: 1.0000 | ORAL_TABLET | Freq: Two times a day (BID) | ORAL | Status: DC

## 2011-11-10 MED ORDER — ASPIRIN EC 325 MG PO TBEC
325.0000 mg | DELAYED_RELEASE_TABLET | Freq: Every day | ORAL | Status: DC
  Administered 2011-11-10: 325 mg via ORAL
  Filled 2011-11-10: qty 1

## 2011-11-10 MED ORDER — ACETAMINOPHEN 325 MG PO TABS: 650.0000 mg | ORAL_TABLET | Freq: Four times a day (QID) | ORAL | Status: DC | PRN

## 2011-11-10 MED ORDER — SODIUM CHLORIDE 0.9 % IV SOLN: 250.0000 mL | INTRAVENOUS | Status: DC | PRN

## 2011-11-10 MED ORDER — ONDANSETRON HCL 4 MG PO TABS
4.0000 mg | ORAL_TABLET | Freq: Four times a day (QID) | ORAL | Status: DC | PRN
  Administered 2011-11-10: 4 mg via ORAL
  Filled 2011-11-10: qty 1

## 2011-11-10 MED ORDER — ACETAMINOPHEN 650 MG RE SUPP: 650.0000 mg | Freq: Four times a day (QID) | RECTAL | Status: DC | PRN

## 2011-11-10 MED ORDER — MORPHINE SULFATE 2 MG/ML IJ SOLN
2.0000 mg | INTRAMUSCULAR | Status: DC | PRN
  Administered 2011-11-10: 2 mg via INTRAVENOUS
  Filled 2011-11-10: qty 1

## 2011-11-10 NOTE — H&P (Signed)
PCP:  Michiel Sites, MD, MD  Confirmed with pt  Cardiology Dr. Herbie Baltimore  Chief Complaint:  Chest pain  HPI: 65yoF with h/o HTN, HL, rheumatoid arthritis, but no prior cardiac history presents with  3wks of chest pain.   Pt is reliable historian, denies any prior cardiac caths, AMI, or other prior cardiac  issues, and isn't really sure why she was ever referred to Dr. Herbie Baltimore, although she does  endorse following with him. She did have a nuclear stress test with results as below in  10/2003 that was essentially negative, this was done for chest pain that she does not  recall. Regardless, she endorses 3 wks of substernal chest tightness/squeezing sensation  radiating into her lower left ribs but nowhere else. She gets associated dizziness,  lightheadedness and feeling that she cannot catch her breath when the pain is present. It  can last for 15 minutes and occurs every few days for past couple week. There is no  relation to exertion and it can occur at rest, and not relieved with rest. She saw her PCP  Dr. Juleen China last Friday with these complaints and was told to see Dr. Herbie Baltimore but has not  been able to schedule an appt.   In the ED, vitals were stable. Labs with normal chem, LFT's, CBC. Negative Trop x1. CT  head done for dizziness with symptoms was negative. CXR negative. ECG was completely  unremarkable. Pt was given zofran, 324 ASA, and SL NTG's with some relief of her  complaints.   ROS as above, otherwise pt also endorses untintentional weight loss for several months,  and negative otherwise.     Past Medical History  Diagnosis Date  . Sleep apnea   . Anxiety   . Asthma   . Bronchitis   . Depression   . GERD (gastroesophageal reflux disease)   . Hypercholesterolemia   . Murmur   . Peptic ulcer   . Rheumatoid arthritis   . Venous thrombosis   . Hypertension   . Genital herpes     Past Surgical History  Procedure Date  . Eye surgery   . Foot surgery   .  Cholecystectomy   . Vaginal hysterectomy   . Nose surgery   . Mouth surgery     Medications:  HOME MEDS: Reconciled with pt  Prior to Admission medications   Medication Sig Start Date End Date Taking? Authorizing Provider  Fluticasone-Salmeterol (ADVAIR) 250-50 MCG/DOSE AEPB Inhale 1 puff into the lungs 2 (two) times daily as needed.    Yes Historical Provider, MD  hydrOXYzine (ATARAX/VISTARIL) 10 MG tablet Take 10 mg by mouth every 8 (eight) hours as needed.    Yes Historical Provider, MD  leflunomide (ARAVA) 20 MG tablet Take 20 mg by mouth daily.     Yes Historical Provider, MD  metaxalone (SKELAXIN) 800 MG tablet Take 800 mg by mouth 3 (three) times daily as needed.    Yes Historical Provider, MD  metoprolol succinate (TOPROL-XL) 25 MG 24 hr tablet Take 25 mg by mouth daily.   Yes Historical Provider, MD  Multiple Vitamin (MULTIVITAMIN) tablet Take 2 tablets by mouth daily.    Yes Historical Provider, MD  pentosan polysulfate (ELMIRON) 100 MG capsule Take 100 mg by mouth 2 (two) times daily.     Yes Historical Provider, MD  pregabalin (LYRICA) 50 MG capsule Take 50 mg by mouth daily.     Yes Historical Provider, MD  Vitamin D, Ergocalciferol, (DRISDOL) 50000 UNITS CAPS Take  50,000 Units by mouth.     Yes Historical Provider, MD  acyclovir (ZOVIRAX) 400 MG tablet Take 400 mg by mouth every 4 (four) hours while awake.      Historical Provider, MD  Calcium Carbonate-Vitamin D (CALCIUM + D PO) Take 1 tablet by mouth daily.      Historical Provider, MD  nitrofurantoin (MACRODANTIN) 100 MG capsule Take 100 mg by mouth 2 (two) times daily.      Historical Provider, MD  Omeprazole-Sodium Bicarbonate (ZEGERID) 20-1100 MG CAPS Take 1 capsule by mouth daily before breakfast.      Historical Provider, MD    Allergies:  Allergies  Allergen Reactions  . Benadryl Allergy Hives  . Betadine (Povidone Iodine)   . Codeine     Social History:   reports that she has quit smoking. She has never used  smokeless tobacco. She reports that she does not drink alcohol or use illicit drugs. She lives at home alone and is ambulatory without cane or walker. She has a daughter.   Family History: Family History  Problem Relation Age of Onset  . Prostate cancer Father   . Diabetes Father   . Diabetes Mother   . Heart disease Mother   . Kidney disease Mother   . Colon polyps Brother   . Diabetes Brother   . Heart disease Brother     Physical Exam: Filed Vitals:   11/10/11 0111 11/10/11 0217 11/10/11 0413 11/10/11 0615  BP: 125/63 143/67  115/67  Pulse: 74 69  71  Temp: 98.7 F (37.1 C)   98 F (36.7 C)  TempSrc: Oral   Oral  Resp: 18   18  Height:    5\' 5"  (1.651 m)  Weight:    52.6 kg (115 lb 15.4 oz)  SpO2: 93%  93% 99%   Blood pressure 115/67, pulse 71, temperature 98 F (36.7 C), temperature source Oral, resp. rate 18, height 5\' 5"  (1.651 m), weight 52.6 kg (115 lb 15.4 oz), SpO2 99.00%. Gen: Thin, middle aged appearing F in no distress, able to relate history well, breathing  comfortably, endorses chest pain but looks comfortable HEENT: Pupils round, reactive, EOMI, sclera clear and normal appearing. Mouth moist,  normal appearing.  Lungs: CTAB no w/c/r. Good air movement, normal exam overall Heart: S1/2 clear and normal sounding, with a early to mid peaking systolic murmur at LUSB  and not heard elsewhere. No gallops. She does jump and grimace and have exaggerated pain  response to palpation of her chest.  Abd: Scaphoid, but soft, NT ND, benign, no facial grimacing to palpation Extrem: Thin, warm, perfusing normally, radials palpable. No Z deformities or other  classic changes of RA, actually unimpressive for RA or other joint deformities. No BLE  edema noted. Neuro: Alert, attentive conversant, CN 2-12 intact, moves extremities on her own, able to  sit up in bed without assistance, grossly non-focal   Labs & Imaging Results for orders placed during the hospital encounter  of 11/10/11 (from the past 48 hour(s))  CBC     Status: Normal   Collection Time   11/10/11  1:40 AM      Component Value Range Comment   WBC 6.3  4.0 - 10.5 (K/uL)    RBC 4.01  3.87 - 5.11 (MIL/uL)    Hemoglobin 12.1  12.0 - 15.0 (g/dL)    HCT 16.1  09.6 - 04.5 (%)    MCV 93.5  78.0 - 100.0 (fL)    MCH  30.2  26.0 - 34.0 (pg)    MCHC 32.3  30.0 - 36.0 (g/dL)    RDW 16.1  09.6 - 04.5 (%)    Platelets 205  150 - 400 (K/uL)   DIFFERENTIAL     Status: Normal   Collection Time   11/10/11  1:40 AM      Component Value Range Comment   Neutrophils Relative 47  43 - 77 (%)    Neutro Abs 3.0  1.7 - 7.7 (K/uL)    Lymphocytes Relative 40  12 - 46 (%)    Lymphs Abs 2.5  0.7 - 4.0 (K/uL)    Monocytes Relative 11  3 - 12 (%)    Monocytes Absolute 0.7  0.1 - 1.0 (K/uL)    Eosinophils Relative 1  0 - 5 (%)    Eosinophils Absolute 0.1  0.0 - 0.7 (K/uL)    Basophils Relative 0  0 - 1 (%)    Basophils Absolute 0.0  0.0 - 0.1 (K/uL)   TROPONIN I     Status: Normal   Collection Time   11/10/11  1:40 AM      Component Value Range Comment   Troponin I <0.30  <0.30 (ng/mL)   COMPREHENSIVE METABOLIC PANEL     Status: Abnormal   Collection Time   11/10/11  1:40 AM      Component Value Range Comment   Sodium 136  135 - 145 (mEq/L)    Potassium 3.8  3.5 - 5.1 (mEq/L)    Chloride 103  96 - 112 (mEq/L)    CO2 26  19 - 32 (mEq/L)    Glucose, Bld 94  70 - 99 (mg/dL)    BUN 14  6 - 23 (mg/dL)    Creatinine, Ser 4.09  0.50 - 1.10 (mg/dL)    Calcium 9.5  8.4 - 10.5 (mg/dL)    Total Protein 6.7  6.0 - 8.3 (g/dL)    Albumin 3.6  3.5 - 5.2 (g/dL)    AST 28  0 - 37 (U/L)    ALT 20  0 - 35 (U/L)    Alkaline Phosphatase 72  39 - 117 (U/L)    Total Bilirubin 0.2 (*) 0.3 - 1.2 (mg/dL)    GFR calc non Af Amer 75 (*) >90 (mL/min)    GFR calc Af Amer 87 (*) >90 (mL/min)   LIPASE, BLOOD     Status: Normal   Collection Time   11/10/11  1:40 AM      Component Value Range Comment   Lipase 54  11 - 59 (U/L)    Dg  Chest 2 View  11/10/2011  *RADIOLOGY REPORT*  Clinical Data: Chest pain.  CHEST - 2 VIEW  Comparison: Chest radiograph performed 12/09/2010  Findings: The lungs are well-aerated and clear.  There is no evidence of focal opacification, pleural effusion or pneumothorax.  The heart is normal in size; the mediastinal contour is within normal limits.  No acute osseous abnormalities are seen.  Clips are noted within the right upper quadrant, reflecting prior cholecystectomy.  IMPRESSION: No acute cardiopulmonary process seen.  Original Report Authenticated By: Tonia Ghent, M.D.   Ct Head Wo Contrast  11/10/2011  *RADIOLOGY REPORT*  Clinical Data: Dizziness and unsteady gait.  CT HEAD WITHOUT CONTRAST  Technique:  Contiguous axial images were obtained from the base of the skull through the vertex without contrast.  Comparison: CT of the head performed 07/05/2009, and MRI of the brain performed 07/07/2009  Findings: There is no evidence of acute infarction, mass lesion, or intra- or extra-axial hemorrhage on CT.  The posterior fossa, including the cerebellum, brainstem and fourth ventricle, is within normal limits.  The third and lateral ventricles, and basal ganglia are unremarkable in appearance.  The cerebral hemispheres are symmetric in appearance, with normal gray- white differentiation.  No mass effect or midline shift is seen.  There is no evidence of fracture; visualized osseous structures are unremarkable in appearance.  The visualized portions of the orbits are within normal limits.  The paranasal sinuses and mastoid air cells are well-aerated.  No significant soft tissue abnormalities are seen.  Scattered small soft tissue calcifications are noted.  IMPRESSION: Unremarkable noncontrast CT of the head.  Original Report Authenticated By: Tonia Ghent, M.D.   ECG: NSR 75, normal axis, normal P and PR, narrow QRS, no ST deviations, normal T waves.  This is a completely normal appearing ECG. Very small  ditzel R wave V2. Repeat ECG the  same, not ischemic, but appears to have Q waves V1-2 that I suspect is likely lead  placement in the setting of very small R waves in V2 previously.   10/2003 nuclear stress test IMPRESSION   No evidence of inducible left ventricular myocardial ischemia or scarring.    Left ventricular ejection fraction calculated at 71%  Impression Present on Admission:  .Chest pain .Rheumatoid arthritis .Hypertension  65yoF with h/o HTN, HL, rheumatoid arthritis, but no prior cardiac history presents with  3wks of chest pain.   1. Chest pain: Pt without prior cardiac history, history sounds atypical, and pt does jump  up noticeably when her chest is palpated, however she does have RF's of HTN, HL, FHx, and  possible rheumatoid arthritis. She has no objective evidence of ischemia at present, with  negative Trop x1 and ECG that is wholly unremarkable. Therefore at present, will do rule  out protocol and if negative for acute infarct, would have her see Dr. Elissa Hefty office  this week for consideration of stress testing.  - Trend enzymes, ECG. ASA 325 and SL NTG's for now but will not treat fully for ACS.  - Close f/u with cardiology if rules out.  - Continue home toprol,   2. Unintentional weight loss: As already discussed with pt, this should be pursued with  primary care MD, and would start with age and sex appropriate malignancy screening, but  did state this would not be pursued on inpatient side. CXR did not show any frank nodules  and pt is never smoker. Unclear other screening though.   3. RA: Continue home leflunomide.  4. Continue home lyrica and elmiron, holding skelexin and other non essential meds   DVT prophy: early ambulation Telemetry, [facility] team 2 Presumed full code   Other plans as per orders.   Donna Payne 11/10/2011, 6:50 AM

## 2011-11-10 NOTE — ED Provider Notes (Signed)
History     CSN: [id number]  Arrival date & time 11/10/11  0053   First MD Initiated Contact with Patient 11/10/11 0114      Chief Complaint  Patient presents with  . Chest Pain    (Consider location/radiation/quality/duration/timing/severity/associated sxs/prior treatment) HPI Comments: Patient reports she has had intermittent chest pain x 3 weeks then this evening was watching television when she developed midchest tightness with associated nausea, mild shortness of breath, headache and unsteady gait/loss of balance.  Denies recent illness, abdominal pain, V/D.  Pt was seen by PCP 5 days ago but did not mention the chest pain to him.  PCP Dr Juleen China.   Patient is a 66 y.o. female presenting with chest pain. The history is provided by the patient.  Chest Pain Primary symptoms include shortness of breath and nausea. Pertinent negatives for primary symptoms include no fever, no cough, no wheezing, no abdominal pain and no vomiting.     Past Medical History  Diagnosis Date  . Sleep apnea   . Anxiety   . Arthritis   . Asthma   . Bronchitis   . Depression   . GERD (gastroesophageal reflux disease)   . Hypercholesterolemia   . Murmur   . Peptic ulcer   . Rheumatoid arthritis   . Venous thrombosis   . Hypertension   . Genital herpes     Past Surgical History  Procedure Date  . Eye surgery   . Foot surgery   . Cholecystectomy   . Vaginal hysterectomy   . Nose surgery   . Mouth surgery     Family History  Problem Relation Age of Onset  . Prostate cancer Father   . Diabetes Father   . Diabetes Mother   . Heart disease Mother   . Kidney disease Mother   . Colon polyps Brother   . Diabetes Brother   . Heart disease Brother     History  Substance Use Topics  . Smoking status: Former Games developer  . Smokeless tobacco: Never Used  . Alcohol Use: No    OB History    Grav Para Term Preterm Abortions TAB SAB Ect Mult Living                  Review of Systems    Constitutional: Negative for fever.  Respiratory: Positive for shortness of breath. Negative for cough, wheezing and stridor.   Cardiovascular: Positive for chest pain.  Gastrointestinal: Positive for nausea. Negative for vomiting, abdominal pain, diarrhea and constipation.  Neurological: Positive for headaches. Negative for speech difficulty.  All other systems reviewed and are negative.    Allergies  Benadryl allergy; Betadine; and Codeine  Home Medications   Current Outpatient Rx  Name Route Sig Dispense Refill  . METOPROLOL SUCCINATE ER 25 MG PO TB24 Oral Take 25 mg by mouth daily.    . ACYCLOVIR 400 MG PO TABS Oral Take 400 mg by mouth every 4 (four) hours while awake.      Marland Kitchen AMLODIPINE BESYLATE-VALSARTAN 5-160 MG PO TABS Oral Take 1 tablet by mouth daily.      Marland Kitchen CALCIUM + D PO Oral Take 1 tablet by mouth daily.      Marland Kitchen DARIFENACIN HYDROBROMIDE ER 7.5 MG PO TB24 Oral Take 7.5 mg by mouth daily.      Marland Kitchen FLUTICASONE-SALMETEROL 250-50 MCG/DOSE IN AEPB Inhalation Inhale 1 puff into the lungs every 12 (twelve) hours.      Marland Kitchen HYDROXYZINE HCL 10 MG PO TABS  Oral Take 10 mg by mouth daily.      Marland Kitchen LEFLUNOMIDE 20 MG PO TABS Oral Take 20 mg by mouth daily.      Marland Kitchen METAXALONE 800 MG PO TABS Oral Take 800 mg by mouth 3 (three) times daily.      Marland Kitchen ONE-DAILY MULTI VITAMINS PO TABS Oral Take 1 tablet by mouth daily.      Marland Kitchen NITROFURANTOIN MACROCRYSTAL 100 MG PO CAPS Oral Take 100 mg by mouth 2 (two) times daily.      . OLOPATADINE HCL 0.2 % OP SOLN Ophthalmic Apply 1 drop to eye daily.      Marland Kitchen OMEPRAZOLE-SODIUM BICARBONATE 20-1100 MG PO CAPS Oral Take 1 capsule by mouth daily before breakfast.      . PENTOSAN POLYSULFATE SODIUM 100 MG PO CAPS Oral Take 100 mg by mouth 2 (two) times daily.      Marland Kitchen PREGABALIN 50 MG PO CAPS Oral Take 50 mg by mouth daily.      Marland Kitchen ALIGN 4 MG PO CAPS Oral Take 1 capsule by mouth daily.      Marland Kitchen VITAMIN D (ERGOCALCIFEROL) 50000 UNITS PO CAPS Oral Take 50,000 Units by mouth.         BP 125/63  Pulse 74  Temp(Src) 98.7 F (37.1 C) (Oral)  Resp 18  SpO2 93%  Physical Exam  Nursing note and vitals reviewed. Constitutional: She is oriented to person, place, and time. She appears well-developed and well-nourished.  HENT:  Head: Normocephalic and atraumatic.  Neck: Neck supple.  Cardiovascular: Normal rate, regular rhythm and normal heart sounds.   Pulmonary/Chest: Effort normal and breath sounds normal. No respiratory distress. She has no wheezes. She has no rales. She exhibits tenderness.    Abdominal: Soft. She exhibits no distension. There is tenderness. There is no rebound and no guarding.  Musculoskeletal: She exhibits no edema and no tenderness.  Neurological: She is alert and oriented to person, place, and time. She exhibits normal muscle tone. Coordination normal.  Psychiatric: She has a normal mood and affect. Her behavior is normal. Thought content normal.    ED Course  Procedures (including critical care time)  Labs Reviewed  COMPREHENSIVE METABOLIC PANEL - Abnormal; Notable for the following:    Total Bilirubin 0.2 (*)    GFR calc non Af Amer 75 (*)    GFR calc Af Amer 87 (*)    All other components within normal limits  CBC  DIFFERENTIAL  TROPONIN I  LIPASE, BLOOD  URINALYSIS, ROUTINE W REFLEX MICROSCOPIC   Dg Chest 2 View  11/10/2011  *RADIOLOGY REPORT*  Clinical Data: Chest pain.  CHEST - 2 VIEW  Comparison: Chest radiograph performed 12/09/2010  Findings: The lungs are well-aerated and clear.  There is no evidence of focal opacification, pleural effusion or pneumothorax.  The heart is normal in size; the mediastinal contour is within normal limits.  No acute osseous abnormalities are seen.  Clips are noted within the right upper quadrant, reflecting prior cholecystectomy.  IMPRESSION: No acute cardiopulmonary process seen.  Original Report Authenticated By: Tonia Ghent, M.D.   Ct Head Wo Contrast  11/10/2011  *RADIOLOGY REPORT*   Clinical Data: Dizziness and unsteady gait.  CT HEAD WITHOUT CONTRAST  Technique:  Contiguous axial images were obtained from the base of the skull through the vertex without contrast.  Comparison: CT of the head performed 07/05/2009, and MRI of the brain performed 07/07/2009  Findings: There is no evidence of acute infarction, mass lesion, or  intra- or extra-axial hemorrhage on CT.  The posterior fossa, including the cerebellum, brainstem and fourth ventricle, is within normal limits.  The third and lateral ventricles, and basal ganglia are unremarkable in appearance.  The cerebral hemispheres are symmetric in appearance, with normal gray- white differentiation.  No mass effect or midline shift is seen.  There is no evidence of fracture; visualized osseous structures are unremarkable in appearance.  The visualized portions of the orbits are within normal limits.  The paranasal sinuses and mastoid air cells are well-aerated.  No significant soft tissue abnormalities are seen.  Scattered small soft tissue calcifications are noted.  IMPRESSION: Unremarkable noncontrast CT of the head.  Original Report Authenticated By: Tonia Ghent, M.D.    Ciprianus.Beagle AM Discussed patient with Dr Dierdre Highman, who will also see the patient.    Patient reports improvement of pain with ASA and nitro.     Date: 11/10/2011  Rate: 75  Rhythm: normal sinus rhythm  QRS Axis: normal  Intervals: normal  ST/T Wave abnormalities: normal  Conduction Disutrbances:none  Narrative Interpretation: abnormal r wave progression  Old EKG Reviewed: none available     1. Chest pain       MDM  Patient with hx HTN, hyperlipidemia, reporting 3 weeks intermittent CP then more severe CP tonight with nausea, feeling "off balance."  Workup in ED negative.  Admitted for chest pain rule out.  I spoke with Carlota Raspberry ([facility]) who will admit the patient.          Rise Patience, PA 11/10/11 0440  Rise Patience, [place] 11/10/11 [phone]

## 2011-11-10 NOTE — Progress Notes (Signed)
   CARE MANAGEMENT NOTE 11/10/2011  Patient:  Donna Payne, Donna Payne   Account Number:  [id number]  Date Initiated:  11/10/2011  Documentation initiated by:  Lanier Clam  Subjective/Objective Assessment:   ADMITTED W/CHEST PAIN.     Action/Plan:   FROM HOME   Anticipated DC Date:  11/11/2011   Anticipated DC Plan:  HOME/SELF CARE         Choice offered to / List presented to:             Status of service:  In process, will continue to follow Medicare Important Message given?   (If response is "NO", the following Medicare IM given date fields will be blank) Date Medicare IM given:   Date Additional Medicare IM given:    Discharge Disposition:    Per UR Regulation:  Reviewed for med. necessity/level of care/duration of stay  If discussed at Long Length of Stay Meetings, dates discussed:    Comments:  11/10/11 [facility] RN,BSN NCM [phone]

## 2011-11-10 NOTE — Progress Notes (Signed)
PT Cancellation Note  Treatment cancelled today due to discussed with patient prior functioning and recent PT at [facility] for back and LE pain/weakness.  Reports had HEP and will have assist from neighbors for meals/grocery runs,etc.  Felt since D/C pending and she already has HEP no need for skilled PT services at this time. Screened only, no charge.Sheran Lawless 11/10/2011, 4:09 PM

## 2011-11-10 NOTE — ED Provider Notes (Signed)
Medical screening examination/treatment/procedure(s) were conducted as a shared visit with non-physician practitioner(s) and myself.  I personally evaluated the patient during the encounter  Sunnie Nielsen, MD 11/10/11 [phone]

## 2011-11-10 NOTE — Discharge Summary (Signed)
Physician Discharge Summary  Donna Payne MRN: [id number] DOB/AGE: 67-Nov-1947 67 y.o.  PCP: Michiel Sites, MD, MD   Admit date: 11/10/2011 Discharge date: 11/10/2011  Discharge Diagnoses:     *Chest pain due to costochondritis  Rheumatoid arthritis  Hypertension Sleep apnea  .  Anxiety  .  Asthma  .  Bronchitis  .  Depression  .  GERD (gastroesophageal reflux disease)  .  Hypercholesterolemia  .  Murmur  .  Peptic ulcer  .  Rheumatoid arthritis  .  Venous thrombosis  .  Hypertension  .  Genital herpes    Medication List  As of 11/10/2011  2:31 PM   TAKE these medications         acyclovir 400 MG tablet   Commonly known as: ZOVIRAX   Take 400 mg by mouth every 4 (four) hours while awake.      aspirin 325 MG EC tablet   Take 1 tablet (325 mg total) by mouth daily.      CALCIUM + D PO   Take 1 tablet by mouth daily.      Fluticasone-Salmeterol 250-50 MCG/DOSE Aepb   Commonly known as: ADVAIR   Inhale 1 puff into the lungs 2 (two) times daily as needed.      hydrOXYzine 10 MG tablet   Commonly known as: ATARAX/VISTARIL   Take 10 mg by mouth every 8 (eight) hours as needed.      leflunomide 20 MG tablet   Commonly known as: ARAVA   Take 20 mg by mouth daily.      metaxalone 800 MG tablet   Commonly known as: SKELAXIN   Take 800 mg by mouth 3 (three) times daily as needed.      metoprolol succinate 25 MG 24 hr tablet   Commonly known as: TOPROL-XL   Take 25 mg by mouth daily.      multivitamin tablet   Take 2 tablets by mouth daily.      nitrofurantoin 100 MG capsule   Commonly known as: MACRODANTIN   Take 100 mg by mouth 2 (two) times daily.      nitroGLYCERIN 0.4 MG SL tablet   Commonly known as: NITROSTAT   Place 1 tablet (0.4 mg total) under the tongue every 5 (five) minutes x 3 doses as needed for chest pain (Do not use if systolic < 100).      Omeprazole-Sodium Bicarbonate 20-1100 MG Caps   Commonly known as:  ZEGERID   Take 1 capsule by mouth daily before breakfast.      pentosan polysulfate 100 MG capsule   Commonly known as: ELMIRON   Take 100 mg by mouth 2 (two) times daily.      pregabalin 50 MG capsule   Commonly known as: LYRICA   Take 50 mg by mouth daily.      Vitamin D (Ergocalciferol) 50000 UNITS Caps   Commonly known as: DRISDOL   Take 50,000 Units by mouth.            Discharge Condition: Stable Disposition: 01-Home or Self Care   Consults: None    Significant Diagnostic Studies: Dg Chest 2 View  11/10/2011  *RADIOLOGY REPORT*  Clinical Data: Chest pain.  CHEST - 2 VIEW  Comparison: Chest radiograph performed 12/09/2010  Findings: The lungs are well-aerated and clear.  There is no evidence of focal opacification, pleural effusion or pneumothorax.  The heart is normal in size; the mediastinal contour is within normal limits.  No acute osseous abnormalities are seen.  Clips are noted within the right upper quadrant, reflecting prior cholecystectomy.  IMPRESSION: No acute cardiopulmonary process seen.  Original Report Authenticated By: Tonia Ghent, M.D.   Ct Head Wo Contrast  11/10/2011  *RADIOLOGY REPORT*  Clinical Data: Dizziness and unsteady gait.  CT HEAD WITHOUT CONTRAST  Technique:  Contiguous axial images were obtained from the base of the skull through the vertex without contrast.  Comparison: CT of the head performed 07/05/2009, and MRI of the brain performed 07/07/2009  Findings: There is no evidence of acute infarction, mass lesion, or intra- or extra-axial hemorrhage on CT.  The posterior fossa, including the cerebellum, brainstem and fourth ventricle, is within normal limits.  The third and lateral ventricles, and basal ganglia are unremarkable in appearance.  The cerebral hemispheres are symmetric in appearance, with normal gray- white differentiation.  No mass effect or midline shift is seen.  There is no evidence of fracture; visualized osseous structures are  unremarkable in appearance.  The visualized portions of the orbits are within normal limits.  The paranasal sinuses and mastoid air cells are well-aerated.  No significant soft tissue abnormalities are seen.  Scattered small soft tissue calcifications are noted.  IMPRESSION: Unremarkable noncontrast CT of the head.  Original Report Authenticated By: Tonia Ghent, M.D.   Ct Angio Chest W/cm &/or Wo Cm  11/10/2011  *RADIOLOGY REPORT*  Clinical Data: Elevated D-dimer, weight loss, shortness of breath, chest pain.  CT ANGIOGRAPHY CHEST  Technique:  Multidetector CT imaging of the chest using the standard protocol during bolus administration of intravenous contrast. Multiplanar reconstructed images including MIPs were obtained and reviewed to evaluate the vascular anatomy.  Contrast:  80 ml Omnipaque 300 IV.  Comparison: 07/05/2009.  Findings: No filling defects in the pulmonary arteries to suggest pulmonary emboli.  Dependent atelectasis in the lungs bilaterally. No pleural effusions. Heart is normal size. Aorta is normal caliber. No mediastinal, hilar, or axillary adenopathy.  Visualized thyroid and chest wall soft tissues unremarkable.  Imaging into the upper abdomen shows no acute findings.  No acute bony abnormality.  There is a small low-density area anteriorly within the left hepatic lobe measuring 12 mm.  Without a history of cancer, suspect this is benign, likely small cyst.  IMPRESSION: No evidence of pulmonary embolus.  Dependent atelectasis in the lungs bilaterally.  Original Report Authenticated By: Cyndie Chime, M.D.     Microbiology: No results found for this or any previous visit (from the past 240 hour(s)).   Labs: Results for orders placed during the hospital encounter of 11/10/11 (from the past 48 hour(s))  CBC     Status: Normal   Collection Time   11/10/11  1:40 AM      Component Value Range Comment   WBC 6.3  4.0 - 10.5 (K/uL)    RBC 4.01  3.87 - 5.11 (MIL/uL)    Hemoglobin 12.1   12.0 - 15.0 (g/dL)    HCT 40.9  81.1 - 91.4 (%)    MCV 93.5  78.0 - 100.0 (fL)    MCH 30.2  26.0 - 34.0 (pg)    MCHC 32.3  30.0 - 36.0 (g/dL)    RDW 78.2  95.6 - 21.3 (%)    Platelets 205  150 - 400 (K/uL)   DIFFERENTIAL     Status: Normal   Collection Time   11/10/11  1:40 AM      Component Value Range Comment   Neutrophils Relative 47  43 - 77 (%)    Neutro Abs 3.0  1.7 - 7.7 (K/uL)    Lymphocytes Relative 40  12 - 46 (%)    Lymphs Abs 2.5  0.7 - 4.0 (K/uL)    Monocytes Relative 11  3 - 12 (%)    Monocytes Absolute 0.7  0.1 - 1.0 (K/uL)    Eosinophils Relative 1  0 - 5 (%)    Eosinophils Absolute 0.1  0.0 - 0.7 (K/uL)    Basophils Relative 0  0 - 1 (%)    Basophils Absolute 0.0  0.0 - 0.1 (K/uL)   TROPONIN I     Status: Normal   Collection Time   11/10/11  1:40 AM      Component Value Range Comment   Troponin I <0.30  <0.30 (ng/mL)   COMPREHENSIVE METABOLIC PANEL     Status: Abnormal   Collection Time   11/10/11  1:40 AM      Component Value Range Comment   Sodium 136  135 - 145 (mEq/L)    Potassium 3.8  3.5 - 5.1 (mEq/L)    Chloride 103  96 - 112 (mEq/L)    CO2 26  19 - 32 (mEq/L)    Glucose, Bld 94  70 - 99 (mg/dL)    BUN 14  6 - 23 (mg/dL)    Creatinine, Ser 1.61  0.50 - 1.10 (mg/dL)    Calcium 9.5  8.4 - 10.5 (mg/dL)    Total Protein 6.7  6.0 - 8.3 (g/dL)    Albumin 3.6  3.5 - 5.2 (g/dL)    AST 28  0 - 37 (U/L)    ALT 20  0 - 35 (U/L)    Alkaline Phosphatase 72  39 - 117 (U/L)    Total Bilirubin 0.2 (*) 0.3 - 1.2 (mg/dL)    GFR calc non Af Amer 75 (*) >90 (mL/min)    GFR calc Af Amer 87 (*) >90 (mL/min)   LIPASE, BLOOD     Status: Normal   Collection Time   11/10/11  1:40 AM      Component Value Range Comment   Lipase 54  11 - 59 (U/L)   URINALYSIS, ROUTINE W REFLEX MICROSCOPIC     Status: Normal   Collection Time   11/10/11  6:37 AM      Component Value Range Comment   Color, Urine YELLOW  YELLOW     APPearance CLEAR  CLEAR     Specific Gravity, Urine 1.021   1.005 - 1.030     pH 6.0  5.0 - 8.0     Glucose, UA NEGATIVE  NEGATIVE (mg/dL)    Hgb urine dipstick NEGATIVE  NEGATIVE     Bilirubin Urine NEGATIVE  NEGATIVE     Ketones, ur NEGATIVE  NEGATIVE (mg/dL)    Protein, ur NEGATIVE  NEGATIVE (mg/dL)    Urobilinogen, UA 0.2  0.0 - 1.0 (mg/dL)    Nitrite NEGATIVE  NEGATIVE     Leukocytes, UA NEGATIVE  NEGATIVE  MICROSCOPIC NOT DONE ON URINES WITH NEGATIVE PROTEIN, BLOOD, LEUKOCYTES, NITRITE, OR GLUCOSE <1000 mg/dL.  CARDIAC PANEL(CRET KIN+CKTOT+MB+TROPI)     Status: Normal   Collection Time   11/10/11  8:31 AM      Component Value Range Comment   Total CK 86  7 - 177 (U/L)    CK, MB 2.0  0.3 - 4.0 (ng/mL)    Troponin I <0.30  <0.30 (ng/mL)    Relative Index RELATIVE  INDEX IS INVALID  0.0 - 2.5    D-DIMER, QUANTITATIVE     Status: Abnormal   Collection Time   11/10/11 10:40 AM      Component Value Range Comment   D-Dimer, Quant 0.63 (*) 0.00 - 0.48 (ug/mL-FEU)    EKG Rate: 75  Rhythm: normal sinus rhythm  QRS Axis: normal  Intervals: normal  ST/T Wave abnormalities: normal  Conduction Disutrbances:none  Narrative Interpretation: abnormal r wave progression  Old EKG Reviewed: none available   HPI :65yoF with h/o HTN, HL, rheumatoid arthritis, but no prior cardiac history presents with  3wks of chest pain. Pt is reliable historian, denies any prior cardiac caths, AMI, or other prior cardiac  issues,  . She did have a nuclear stress test  in  10/2003 that was essentially negative,   she endorses 3 wks of substernal chest tightness/squeezing sensation  radiating into her lower left ribs but nowhere else. She gets associated dizziness,  lightheadedness and feeling that she cannot catch her breath when the pain is present. It  can last for 15 minutes and occurs every few days for past couple week. There is no  relation to exertion and it can occur at rest, and not relieved with rest. She saw her PCP  Dr. Juleen China last Friday and she was  referred to the ED . In the ED, vitals were stable. Labs with normal chem, LFT's, CBC. Negative Trop x1. CT  head done for dizziness with symptoms was negative. CXR negative. ECG was completely  unremarkable. Pt was given zofran, 324 ASA, and SL NTG's with some relief of her  complaints.     HOSPITAL COURSE:  #1 chest pain reproducible in the left intercostal margin, no relationship to activity, aggravated by palpation of the area. The patient has no previous cardiac history, given her elevated d-dimer she was ruled out for a DVT and a PE, I would recommend continuing her on aspirin and sublingual nitroglycerin, until the patient gets outpatient stress test. A 2-D echo has been ordered and is pending at this time. We will discharge the patient after this is completed. According markers are negative her telemetry is uneventful   Discharge Exam:  Blood pressure 125/77, pulse 68, temperature 97.5 F (36.4 C), temperature source Oral, resp. rate 18, height 5\' 5"  (1.651 m), weight 52.6 kg (115 lb 15.4 oz), SpO2 97.00%.  comfortably, endorses chest pain but looks comfortable  HEENT: Pupils round, reactive, EOMI, sclera clear and normal appearing. Mouth moist,  normal appearing.  Lungs: CTAB no w/c/r. Good air movement, normal exam overall  Heart: S1/2 clear and normal sounding, with a early to mid peaking systolic murmur at LUSB  and not heard elsewhere. No gallops. She does jump and grimace and have exaggerated pain  response to palpation of her chest.  Abd: Scaphoid, but soft, NT ND, benign, no facial grimacing to palpation  Extrem: Thin, warm, perfusing normally, radials palpable. No Z deformities or other  classic changes of RA, actually unimpressive for RA or other joint deformities. No BLE  edema noted.  Neuro: Alert, attentive conversant, CN 2-12 intact, moves extremities on her own, able to  sit up in bed without assistance, grossly non-focal    Discharge Orders    Future  Appointments: Provider: Department: Dept Phone: Center:   11/26/2011 11:15 AM Meryl Dare, MD,FACG [facility] Office [phone] [facility]        Signed: Richarda Overlie 11/10/2011, 2:31 PM

## 2011-11-10 NOTE — Progress Notes (Signed)
*  PRELIMINARY RESULTS* Echocardiogram 2D Echocardiogram has been performed.  Glean Salen [facility] 11/10/2011, 3:01 PM

## 2011-11-10 NOTE — Progress Notes (Signed)
Patient seen and examined Chest pain is reproducible We'll order a d-dimer is elevated we'll do a CT angiography chest to rule out PE We will also do a 2-D echo to rule out wall motion abnormalities If normal patient can go home tomorrow, and get an outpatient stress test

## 2011-11-10 NOTE — Discharge Instructions (Signed)
Followup stress test to be arranged by PCP in 1-2 weeks

## 2011-11-10 NOTE — Progress Notes (Signed)
Bilateral lower extremity venous duplex completed.  Preliminary report is negative for DVT, SVT, or a Baker's cyst. 

## 2011-11-10 NOTE — ED Notes (Signed)
3:40 AM patient evaluated for substernal chest pain, improving with aspirin nitroglycerin. Heart regular rate and rhythm lungs clear to auscultation. No clubbing cyanosis or edema. EKG reviewed. Plan labs and x-ray and medical admission for chest pain and 66 year old female.   Medical screening examination/treatment/procedure(s) were conducted as a shared visit with non-physician practitioner(s) and myself.  I personally evaluated the patient during the encounter  Results for orders placed during the hospital encounter of 11/10/11  CBC      Component Value Range   WBC 6.3  4.0 - 10.5 (K/uL)   RBC 4.01  3.87 - 5.11 (MIL/uL)   Hemoglobin 12.1  12.0 - 15.0 (g/dL)   HCT 25.3  66.4 - 40.3 (%)   MCV 93.5  78.0 - 100.0 (fL)   MCH 30.2  26.0 - 34.0 (pg)   MCHC 32.3  30.0 - 36.0 (g/dL)   RDW 47.4  25.9 - 56.3 (%)   Platelets 205  150 - 400 (K/uL)  DIFFERENTIAL      Component Value Range   Neutrophils Relative 47  43 - 77 (%)   Neutro Abs 3.0  1.7 - 7.7 (K/uL)   Lymphocytes Relative 40  12 - 46 (%)   Lymphs Abs 2.5  0.7 - 4.0 (K/uL)   Monocytes Relative 11  3 - 12 (%)   Monocytes Absolute 0.7  0.1 - 1.0 (K/uL)   Eosinophils Relative 1  0 - 5 (%)   Eosinophils Absolute 0.1  0.0 - 0.7 (K/uL)   Basophils Relative 0  0 - 1 (%)   Basophils Absolute 0.0  0.0 - 0.1 (K/uL)  TROPONIN I      Component Value Range   Troponin I <0.30  <0.30 (ng/mL)  COMPREHENSIVE METABOLIC PANEL      Component Value Range   Sodium 136  135 - 145 (mEq/L)   Potassium 3.8  3.5 - 5.1 (mEq/L)   Chloride 103  96 - 112 (mEq/L)   CO2 26  19 - 32 (mEq/L)   Glucose, Bld 94  70 - 99 (mg/dL)   BUN 14  6 - 23 (mg/dL)   Creatinine, Ser 8.75  0.50 - 1.10 (mg/dL)   Calcium 9.5  8.4 - 64.3 (mg/dL)   Total Protein 6.7  6.0 - 8.3 (g/dL)   Albumin 3.6  3.5 - 5.2 (g/dL)   AST 28  0 - 37 (U/L)   ALT 20  0 - 35 (U/L)   Alkaline Phosphatase 72  39 - 117 (U/L)   Total Bilirubin 0.2 (*) 0.3 - 1.2 (mg/dL)   GFR calc non Af Amer 75 (*)  >90 (mL/min)   GFR calc Af Amer 87 (*) >90 (mL/min)  LIPASE, BLOOD      Component Value Range   Lipase 54  11 - 59 (U/L)   Dg Chest 2 View  11/10/2011  *RADIOLOGY REPORT*  Clinical Data: Chest pain.  CHEST - 2 VIEW  Comparison: Chest radiograph performed 12/09/2010  Findings: The lungs are well-aerated and clear.  There is no evidence of focal opacification, pleural effusion or pneumothorax.  The heart is normal in size; the mediastinal contour is within normal limits.  No acute osseous abnormalities are seen.  Clips are noted within the right upper quadrant, reflecting prior cholecystectomy.  IMPRESSION: No acute cardiopulmonary process seen.  Original Report Authenticated By: Tonia Ghent, M.D.       Sunnie Nielsen, MD 11/10/11 [phone]

## 2011-11-10 NOTE — ED Notes (Signed)
Pt states she is having chest pain that started about 30 mins ago  Pt states has some shortness of breath, dizziness, nausea, and an unsteady gait  Pt states sxs started about 30 min ago  Pt states she was resting when the pain started

## 2011-11-26 ENCOUNTER — Ambulatory Visit: Payer: Medicare Other | Admitting: Gastroenterology

## 2013-02-09 ENCOUNTER — Ambulatory Visit: Payer: Medicare Other | Admitting: Cardiology

## 2013-08-29 ENCOUNTER — Other Ambulatory Visit: Payer: Self-pay | Admitting: Cardiology

## 2013-10-01 ENCOUNTER — Other Ambulatory Visit: Payer: Self-pay | Admitting: Cardiology

## 2013-10-02 NOTE — Telephone Encounter (Signed)
Rx was sent to pharmacy electronically. 

## 2013-10-17 ENCOUNTER — Other Ambulatory Visit: Payer: Self-pay | Admitting: *Deleted

## 2013-10-17 MED ORDER — METOPROLOL SUCCINATE ER 25 MG PO TB24: 25.0000 mg | ORAL_TABLET | Freq: Every day | ORAL | Status: DC

## 2013-10-19 ENCOUNTER — Telehealth: Payer: Self-pay | Admitting: *Deleted

## 2013-10-19 NOTE — Telephone Encounter (Signed)
Pt was returning someone's phone call. She did not know who called her and I did not see where anyone called her either.  DH

## 2013-10-19 NOTE — Telephone Encounter (Signed)
No record of a clinical call to pt.  Pt does have a Recall to see Dr. Herbie Baltimore in May and it may have been from scheduling.    Message sent back to [place].

## 2013-11-20 ENCOUNTER — Other Ambulatory Visit: Payer: Self-pay | Admitting: *Deleted

## 2013-11-20 MED ORDER — METOPROLOL SUCCINATE ER 25 MG PO TB24: 25.0000 mg | ORAL_TABLET | Freq: Every day | ORAL | Status: DC

## 2013-11-20 NOTE — Telephone Encounter (Signed)
Refilled # 30 metoprolol succ 25 mg with no refills. Patient has appointment scheduled in May.

## 2013-11-29 ENCOUNTER — Encounter: Payer: Self-pay | Admitting: *Deleted

## 2013-12-01 ENCOUNTER — Encounter: Payer: Self-pay | Admitting: Cardiology

## 2013-12-04 ENCOUNTER — Encounter: Payer: Self-pay | Admitting: Cardiology

## 2013-12-04 ENCOUNTER — Ambulatory Visit (INDEPENDENT_AMBULATORY_CARE_PROVIDER_SITE_OTHER): Payer: Medicare Other | Admitting: Cardiology

## 2013-12-04 VITALS — BP 134/80 | HR 77 | Ht 65.0 in | Wt 117.5 lb

## 2013-12-04 DIAGNOSIS — R55 Syncope and collapse: Secondary | ICD-10-CM

## 2013-12-04 DIAGNOSIS — I10 Essential (primary) hypertension: Secondary | ICD-10-CM

## 2013-12-04 DIAGNOSIS — R002 Palpitations: Secondary | ICD-10-CM

## 2013-12-04 DIAGNOSIS — R079 Chest pain, unspecified: Secondary | ICD-10-CM

## 2013-12-04 DIAGNOSIS — E7800 Pure hypercholesterolemia, unspecified: Secondary | ICD-10-CM

## 2013-12-04 DIAGNOSIS — R011 Cardiac murmur, unspecified: Secondary | ICD-10-CM

## 2013-12-04 NOTE — Patient Instructions (Signed)
Please have primary care physician find you a cardiologist in [place]  Please do not drive due to block out spells  Your physician has recommended you make the following change in your medication: If you feeling funny that day or sluggish please hold blood pressure medication

## 2013-12-05 ENCOUNTER — Telehealth: Payer: Self-pay | Admitting: Cardiology

## 2013-12-05 NOTE — Telephone Encounter (Signed)
Patient called in. States she messed up her medications at her OV with Dr. Herbie Baltimore yesterday - medication list updated accordingly. Dr. Herbie Baltimore will be notified.   leflunomide (arava) 20mg  QD removed from list.

## 2013-12-05 NOTE — Telephone Encounter (Signed)
Patient states that she has a medication question.

## 2013-12-05 NOTE — Telephone Encounter (Signed)
OK.  Thanks,  Marykay Lex, MD

## 2013-12-06 ENCOUNTER — Encounter: Payer: Self-pay | Admitting: Cardiology

## 2013-12-06 DIAGNOSIS — R011 Cardiac murmur, unspecified: Secondary | ICD-10-CM | POA: Insufficient documentation

## 2013-12-06 DIAGNOSIS — E7800 Pure hypercholesterolemia, unspecified: Secondary | ICD-10-CM | POA: Insufficient documentation

## 2013-12-06 DIAGNOSIS — R002 Palpitations: Secondary | ICD-10-CM | POA: Insufficient documentation

## 2013-12-06 DIAGNOSIS — R55 Syncope and collapse: Secondary | ICD-10-CM | POA: Insufficient documentation

## 2013-12-06 NOTE — Assessment & Plan Note (Signed)
Low likelihood that this is cardiac chest pain. Atypical features not at the same location or time or with activities. Both at rest and exertion.

## 2013-12-06 NOTE — Assessment & Plan Note (Addendum)
Blood pressure actually well-controlled today.

## 2013-12-06 NOTE — Assessment & Plan Note (Signed)
Very concerning episodes. Not sure what the best course of action is that she is moving to [place]. I recommended that she does not drive until this is an evaluated. We agreed that does not make much sense for me to start the evaluation on this when she is going to be moving and not going back to this location. Do think though that she needs to get associated with primary physician in [place] and quickly get her cardiologist and possibly even neurologist.  These episodes don't really sound arrhythmic in nature. 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 She is not hypotensive and is on minimal medications.

## 2013-12-06 NOTE — Progress Notes (Signed)
PATIENT: Donna Payne MRN: [id number]  DOB: [birth year]   DOV:12/06/2013 PCP: Pcp Not In System  Clinic Note: Chief Complaint  Patient presents with  . Follow-up    1 year follow up, has chest pain at random occasions, cramping in feet when walking.     HPI: Donna Payne is a 68 y.o.  female with a PMH below who presents today for evaluation with her last visit being in August of 2012.  She carries diagnoses of hypertension and possible near syncope. That occurred while at Monday. She was evaluated with a Myoview that was negative for preoperative assessment for back surgery. She's had significant palpitations in the past, initially she was on metoprolol. This helped her symptoms notably. She's had a Cardiolite showed mild LVH and abnormal relaxation but normal EF.  Interval History: It has been over 3 years since I seen her. She basically wanted to be seen at the request of Dr. Juleen China prior to her formally moving to [place]. Shehas multiple mild, unusual complaints. She has occasional chest heaviness and tightness that are sometimes with exertion sometimes not. They're not in the same location. She says her palpitations are not as notable, she is not currently taking hardly any of her medications that she was previously on. I think the only thing she's taking is Lyrica,Avair, Exforge, Zovirax and Arava. She isn't having varied frequency of blackout spells where she denies any significant palpitations or rapid heart rate associated with it. She connects just be sitting there and things is that of a blank and she comes to with no postictal changes. She is not confused, but is not aware of any prodromal symptoms. She does have leg cramping is mostly foot pain and not at all claudication type symptoms with rest or exertion. More neuropathic symptom. No PND, orthopnea or edema. No exertional dyspnea beyond what has been her baseline -- she is long-term former smoker.  No TIA or amaurosis fugax  symptoms. No melena, hematochezia hematuria, epistaxis.  Past Medical History  Diagnosis Date  . Sleep apnea   . Anxiety   . Asthma   . Bronchitis   . Depression   . GERD (gastroesophageal reflux disease)   . Hypercholesterolemia   . Murmur   . Peptic ulcer   . Rheumatoid arthritis(714.0)   . Venous thrombosis   . Hypertension   . Genital herpes   . Heart palpitations     had been stable on beta blocker.    Prior Cardiac Evaluation and Past Surgical History: Past Surgical History  Procedure Laterality Date  . Eye surgery    . Foot surgery    . Cholecystectomy    . Vaginal hysterectomy    . Nose surgery    . Mouth surgery    . Doppler echocardiography  Nov 26, 2010    mild concentric LVH, abnormal relaxation, EF 55%.  . Nm myoview ltd  May 4,2012    persantine myoview: no ischemia or infarction , normal   . Doppler echocardiography  11/10/2011    LV EF 55 -60%,MITRAL VALVE mildy calcified annulus monbility was not restricted  . Holter monitor  10/09/2010    occasional  PVC's    Allergies  Allergen Reactions  . Betadine [Povidone Iodine]   . Codeine   . Diphenhydramine Hcl Hives    Current Outpatient Prescriptions  Medication Sig Dispense Refill  . acyclovir (ZOVIRAX) 400 MG tablet Take 400 mg by mouth every 4 (four) hours while awake.        Marland Kitchen  amLODipine-valsartan (EXFORGE) 5-160 MG per tablet Take 1 tablet by mouth daily.      . Fluticasone-Salmeterol (ADVAIR) 250-50 MCG/DOSE AEPB Inhale 1 puff into the lungs 2 (two) times daily as needed.   not taking    . Multiple Vitamin (MULTIVITAMIN) tablet Take 2 tablets by mouth daily.       . pentosan polysulfate (ELMIRON) 100 MG capsule Take 100 mg by mouth 2 (two) times daily.    not taking    . pregabalin (LYRICA) 50 MG capsule Take 50 mg by mouth daily as needed.       . chlorpheniramine-HYDROcodone (TUSSIONEX) 10-8 MG/5ML LQCR Take 5 mLs by mouth every 12 (twelve) hours as needed for cough.  not taking    .  cyclobenzaprine (FLEXERIL) 10 MG tablet Take 10 mg by mouth 2 (two) times daily as needed for muscle spasms.  not taking    . folic acid (FOLVITE) 1 MG tablet Take 1 mg by mouth daily.      . metoprolol succinate (TOPROL-XL) 25 MG 24 hr tablet Take 25 mg by mouth daily.  not taking    . nitroGLYCERIN (NITROSTAT) 0.4 MG SL tablet Place 1 tablet (0.4 mg total) under the tongue every 5 (five) minutes x 3 doses as needed for chest pain (Do not use if systolic < 100).  not taking  0  . Nutritional Supplements (MENOPAUSE FORMULA PO) Take by mouth daily.  not taking    . pravastatin (PRAVACHOL) 80 MG tablet Take 80 mg by mouth daily.  not taking    . RABEprazole (ACIPHEX) 20 MG tablet Take 20 mg by mouth daily.  not taking    . sulfaSALAzine (AZULFIDINE) 500 MG tablet Take 500 mg by mouth 2 (two) times daily.  not taking    . traMADol (ULTRAM) 50 MG tablet Take by mouth every 6 (six) hours as needed.  not taking     No current facility-administered medications for this visit.   History   Social History Narrative   She is a divorced mother of 1. She does not get regular exercise yet.    Does not smoke.former smoker who quit several years ago. Does not drink   She recently moved to [place] to be closer to family.   She is working on establishing a PCP there.   ROS: A comprehensive Review of Systems - Negative except foot cramping, feeling jittery.otherwise no GI, or GU symptoms. No significant coughing, wheezing or sneezing.  PHYSICAL EXAM BP 134/80  Pulse 77  Ht 5\' 5"  (1.651 m)  Wt 117 lb 8 oz (53.298 kg)  BMI 19.55 kg/m2 General appearance: alert, cooperative, appears stated age, no distress and otherwise healthy-appearing Neck: no adenopathy, no carotid bruit, no JVD and supple, symmetrical, trachea midline Lungs: clear to auscultation bilaterally, normal percussion bilaterally and nonlabored, good air movement Heart: regular rate and rhythm, S1, S2 normal, no murmur, click, rub or gallop  and normal apical impulse Abdomen: soft, non-tender; bowel sounds normal; no masses,  no organomegaly Extremities: extremities normal, atraumatic, no cyanosis or edema Pulses: 2+ and symmetric Neurologic: Mental status: Alert, oriented, thought content appropriate, affect: blunted and flat Cranial nerves: normal   Adult ECG Report  Rate: 77 ;  Rhythm: normal sinus rhythm - possible left atrial enlargement. Nonspecific ST and T wave changes. Computer read early repolarization changes versus pericarditis versus injury with ST elevation, this is probably more related to artifact.  normal axis, intervals, voltage and durations.  Recent Labs: none  ASSESSMENT / PLAN: No problem-specific assessment & plan notes found for this encounter.   Orders Placed This Encounter  Procedures  . EKG 12-Lead   No orders of the defined types were placed in this encounter.    Followup: PRN -- will need cardiologist in [place]  DAVID W. Herbie Baltimore, M.D., M.S. Interventional Cardiology [facility]

## 2013-12-07 NOTE — Assessment & Plan Note (Signed)
History of PVCs. No longer taking beta blocker. She is not really feeling palpitations. For now until the syncope was evaluated I would not restart beta blocker.

## 2013-12-07 NOTE — Progress Notes (Signed)
Lab reports sent by PCP.  Scans and reviewed in Epic.from 06/05/2013   CBC: W41, H./H. 13.2/41.3, platelets 198.  CMP: ALT 25, AST 32, alkaline phosphatase 78, albumin 4.7, total protein 6.5. total bili < 0.2, calcium 9.9; sodium 142, potassium 4.2, chloride 100, bicarbonate 26, BUN 11, creatinine 0.73, glucose 97.  Lipid panel:03/23/2013 -- TC-1 C7 comment HDL 67, LDL 84, TG 80.;;   NMR: 02/07/2013 -- total cholesterol 179(goal < 200), LDL-P. 1223 (goal < 1000)LDL-C101, HDL-C 61, LDL size 20.5 (at low end of normal), small LDL-P6 44 (goal < 527); triglycerides 86   Marykay Lex, M.D., M.S. Interventional Cardiologist

## 2013-12-07 NOTE — Assessment & Plan Note (Signed)
I didn't personally hear murmur today. Nothing that showed significant on her echo in the past.

## 2013-12-07 NOTE — Assessment & Plan Note (Signed)
Not currently taking statin. We'll defer to her new PCP.

## 2013-12-25 ENCOUNTER — Other Ambulatory Visit: Payer: Self-pay | Admitting: *Deleted

## 2013-12-25 ENCOUNTER — Telehealth: Payer: Self-pay | Admitting: Cardiology

## 2013-12-25 MED ORDER — METOPROLOL SUCCINATE ER 25 MG PO TB24: 25.0000 mg | ORAL_TABLET | Freq: Every day | ORAL | Status: DC

## 2013-12-25 NOTE — Telephone Encounter (Signed)
Patient states that she was here last month and told Dr. Herbie Baltimore that she was not taking her Toprol-XL 25 mg.   She states that she is still taking the medication and would we please call the pharmacy for the refill.

## 2014-07-23 ENCOUNTER — Other Ambulatory Visit: Payer: Self-pay | Admitting: Cardiology

## 2014-07-23 NOTE — Telephone Encounter (Signed)
Rx refill sent to patient pharmacy   

## 2014-08-30 DIAGNOSIS — M549 Dorsalgia, unspecified: Secondary | ICD-10-CM | POA: Diagnosis not present

## 2014-08-30 DIAGNOSIS — I739 Peripheral vascular disease, unspecified: Secondary | ICD-10-CM | POA: Diagnosis not present

## 2014-09-03 DIAGNOSIS — R109 Unspecified abdominal pain: Secondary | ICD-10-CM | POA: Diagnosis not present

## 2014-09-03 DIAGNOSIS — N3946 Mixed incontinence: Secondary | ICD-10-CM | POA: Diagnosis not present

## 2014-09-03 DIAGNOSIS — M0609 Rheumatoid arthritis without rheumatoid factor, multiple sites: Secondary | ICD-10-CM | POA: Diagnosis not present

## 2014-09-03 DIAGNOSIS — N3010 Interstitial cystitis (chronic) without hematuria: Secondary | ICD-10-CM | POA: Diagnosis not present

## 2014-09-03 DIAGNOSIS — M5136 Other intervertebral disc degeneration, lumbar region: Secondary | ICD-10-CM | POA: Diagnosis not present

## 2014-12-08 ENCOUNTER — Other Ambulatory Visit: Payer: Self-pay | Admitting: Cardiology

## 2014-12-26 ENCOUNTER — Other Ambulatory Visit: Payer: Self-pay | Admitting: Cardiology

## 2014-12-26 NOTE — Telephone Encounter (Signed)
Rx(s) sent to pharmacy electronically.  

## 2015-01-05 DIAGNOSIS — R1110 Vomiting, unspecified: Secondary | ICD-10-CM | POA: Diagnosis not present

## 2015-01-05 DIAGNOSIS — Z885 Allergy status to narcotic agent status: Secondary | ICD-10-CM | POA: Diagnosis not present

## 2015-01-05 DIAGNOSIS — Z79891 Long term (current) use of opiate analgesic: Secondary | ICD-10-CM | POA: Diagnosis not present

## 2015-01-05 DIAGNOSIS — Z90710 Acquired absence of both cervix and uterus: Secondary | ICD-10-CM | POA: Diagnosis not present

## 2015-01-05 DIAGNOSIS — Z7951 Long term (current) use of inhaled steroids: Secondary | ICD-10-CM | POA: Diagnosis not present

## 2015-01-05 DIAGNOSIS — I10 Essential (primary) hypertension: Secondary | ICD-10-CM | POA: Diagnosis not present

## 2015-01-05 DIAGNOSIS — R42 Dizziness and giddiness: Secondary | ICD-10-CM | POA: Diagnosis not present

## 2015-01-05 DIAGNOSIS — N281 Cyst of kidney, acquired: Secondary | ICD-10-CM | POA: Diagnosis not present

## 2015-01-05 DIAGNOSIS — Z9049 Acquired absence of other specified parts of digestive tract: Secondary | ICD-10-CM | POA: Diagnosis not present

## 2015-01-05 DIAGNOSIS — R1030 Lower abdominal pain, unspecified: Secondary | ICD-10-CM | POA: Diagnosis not present

## 2015-01-05 DIAGNOSIS — Z9089 Acquired absence of other organs: Secondary | ICD-10-CM | POA: Diagnosis not present

## 2015-01-05 DIAGNOSIS — R4182 Altered mental status, unspecified: Secondary | ICD-10-CM | POA: Diagnosis not present

## 2015-01-05 DIAGNOSIS — K7689 Other specified diseases of liver: Secondary | ICD-10-CM | POA: Diagnosis not present

## 2015-01-05 DIAGNOSIS — R51 Headache: Secondary | ICD-10-CM | POA: Diagnosis not present

## 2015-01-05 DIAGNOSIS — R110 Nausea: Secondary | ICD-10-CM | POA: Diagnosis not present

## 2015-01-05 DIAGNOSIS — K5900 Constipation, unspecified: Secondary | ICD-10-CM | POA: Diagnosis not present

## 2015-01-05 DIAGNOSIS — Z79899 Other long term (current) drug therapy: Secondary | ICD-10-CM | POA: Diagnosis not present

## 2015-03-19 DIAGNOSIS — M549 Dorsalgia, unspecified: Secondary | ICD-10-CM | POA: Diagnosis not present

## 2015-03-19 DIAGNOSIS — M0609 Rheumatoid arthritis without rheumatoid factor, multiple sites: Secondary | ICD-10-CM | POA: Diagnosis not present

## 2015-03-19 DIAGNOSIS — Z79899 Other long term (current) drug therapy: Secondary | ICD-10-CM | POA: Diagnosis not present

## 2015-05-20 ENCOUNTER — Other Ambulatory Visit: Payer: Self-pay | Admitting: Cardiology

## 2015-05-21 NOTE — Telephone Encounter (Signed)
Spoke to patient. Patient states she has not located a cardiologist in [place]. She states she is only seeing Dr Juleen China in [place]. She would like medications sent to Dr Juleen China.

## 2015-05-21 NOTE — Telephone Encounter (Signed)
Per last office visit, patient has relocated to [place]. Please advise on refill. Thanks, MI

## 2015-05-22 NOTE — Telephone Encounter (Signed)
I'm okay with refilling cardiac medications if she has not yet established a new cardiologist.  Marykay Lex, MD

## 2015-06-12 ENCOUNTER — Other Ambulatory Visit: Payer: Self-pay | Admitting: Cardiology

## 2015-06-13 NOTE — Telephone Encounter (Signed)
REFILL 

## 2015-06-17 DIAGNOSIS — B1920 Unspecified viral hepatitis C without hepatic coma: Secondary | ICD-10-CM | POA: Diagnosis not present

## 2015-06-17 DIAGNOSIS — B182 Chronic viral hepatitis C: Secondary | ICD-10-CM | POA: Diagnosis not present

## 2015-06-25 DIAGNOSIS — Z79899 Other long term (current) drug therapy: Secondary | ICD-10-CM | POA: Diagnosis not present

## 2015-06-25 DIAGNOSIS — M549 Dorsalgia, unspecified: Secondary | ICD-10-CM | POA: Diagnosis not present

## 2015-06-25 DIAGNOSIS — B1920 Unspecified viral hepatitis C without hepatic coma: Secondary | ICD-10-CM | POA: Diagnosis not present

## 2015-06-25 DIAGNOSIS — M0609 Rheumatoid arthritis without rheumatoid factor, multiple sites: Secondary | ICD-10-CM | POA: Diagnosis not present

## 2015-07-29 ENCOUNTER — Other Ambulatory Visit: Payer: Self-pay | Admitting: Cardiology

## 2015-07-30 NOTE — Telephone Encounter (Signed)
Rx request sent to pharmacy.  

## 2015-07-30 NOTE — Telephone Encounter (Signed)
Pt would like for you to notify her when you call this in.

## 2015-07-30 NOTE — Telephone Encounter (Signed)
°*  STAT* If patient is at the pharmacy, call can be transferred to refill team.   1. Which medications need to be refilled? (please list name of each medication and dose if known) Metoprolol  2. Which pharmacy/location (including street and city if local pharmacy) is medication to be sent to?[phone]  3. Do they need a 30 day or 90 day supply? 90 and refills

## 2015-08-29 ENCOUNTER — Other Ambulatory Visit: Payer: Self-pay | Admitting: Cardiology

## 2015-09-03 DIAGNOSIS — E789 Disorder of lipoprotein metabolism, unspecified: Secondary | ICD-10-CM | POA: Diagnosis not present

## 2015-09-03 DIAGNOSIS — M549 Dorsalgia, unspecified: Secondary | ICD-10-CM | POA: Diagnosis not present

## 2015-09-03 DIAGNOSIS — I10 Essential (primary) hypertension: Secondary | ICD-10-CM | POA: Diagnosis not present

## 2015-10-09 DIAGNOSIS — Z0000 Encounter for general adult medical examination without abnormal findings: Secondary | ICD-10-CM | POA: Diagnosis not present

## 2015-10-09 DIAGNOSIS — N3010 Interstitial cystitis (chronic) without hematuria: Secondary | ICD-10-CM | POA: Diagnosis not present

## 2015-10-09 DIAGNOSIS — R3989 Other symptoms and signs involving the genitourinary system: Secondary | ICD-10-CM | POA: Diagnosis not present

## 2015-11-04 ENCOUNTER — Other Ambulatory Visit: Payer: Self-pay

## 2015-11-04 DIAGNOSIS — Z1231 Encounter for screening mammogram for malignant neoplasm of breast: Secondary | ICD-10-CM

## 2015-11-25 ENCOUNTER — Ambulatory Visit
Admission: RE | Admit: 2015-11-25 | Discharge: 2015-11-25 | Disposition: A | Discharge status: Home / Self Care | Payer: Medicare Other | Source: Ambulatory Visit

## 2015-11-25 DIAGNOSIS — Z1231 Encounter for screening mammogram for malignant neoplasm of breast: Secondary | ICD-10-CM | POA: Diagnosis not present

## 2016-12-31 DIAGNOSIS — E789 Disorder of lipoprotein metabolism, unspecified: Secondary | ICD-10-CM | POA: Diagnosis not present

## 2016-12-31 DIAGNOSIS — I10 Essential (primary) hypertension: Secondary | ICD-10-CM | POA: Diagnosis not present

## 2016-12-31 DIAGNOSIS — R0602 Shortness of breath: Secondary | ICD-10-CM | POA: Diagnosis not present

## 2016-12-31 DIAGNOSIS — M549 Dorsalgia, unspecified: Secondary | ICD-10-CM | POA: Diagnosis not present

## 2017-02-24 DIAGNOSIS — R350 Frequency of micturition: Secondary | ICD-10-CM | POA: Diagnosis not present

## 2017-02-24 DIAGNOSIS — N3010 Interstitial cystitis (chronic) without hematuria: Secondary | ICD-10-CM | POA: Diagnosis not present

## 2017-04-09 DIAGNOSIS — M542 Cervicalgia: Secondary | ICD-10-CM | POA: Diagnosis not present

## 2017-04-09 DIAGNOSIS — R531 Weakness: Secondary | ICD-10-CM | POA: Diagnosis not present

## 2017-04-09 DIAGNOSIS — R2689 Other abnormalities of gait and mobility: Secondary | ICD-10-CM | POA: Diagnosis not present

## 2017-04-09 DIAGNOSIS — M545 Low back pain: Secondary | ICD-10-CM | POA: Diagnosis not present

## 2017-04-16 DIAGNOSIS — R531 Weakness: Secondary | ICD-10-CM | POA: Diagnosis not present

## 2017-04-16 DIAGNOSIS — R2689 Other abnormalities of gait and mobility: Secondary | ICD-10-CM | POA: Diagnosis not present

## 2017-04-21 DIAGNOSIS — Z79899 Other long term (current) drug therapy: Secondary | ICD-10-CM | POA: Diagnosis not present

## 2017-04-21 DIAGNOSIS — I10 Essential (primary) hypertension: Secondary | ICD-10-CM | POA: Diagnosis not present

## 2017-04-21 DIAGNOSIS — N3010 Interstitial cystitis (chronic) without hematuria: Secondary | ICD-10-CM | POA: Diagnosis not present

## 2017-04-21 DIAGNOSIS — M2550 Pain in unspecified joint: Secondary | ICD-10-CM | POA: Diagnosis not present

## 2017-05-05 DIAGNOSIS — H53031 Strabismic amblyopia, right eye: Secondary | ICD-10-CM | POA: Diagnosis not present

## 2017-05-05 DIAGNOSIS — H5043 Accommodative component in esotropia: Secondary | ICD-10-CM | POA: Diagnosis not present

## 2017-05-05 DIAGNOSIS — H04129 Dry eye syndrome of unspecified lacrimal gland: Secondary | ICD-10-CM | POA: Diagnosis not present

## 2017-05-25 DIAGNOSIS — M542 Cervicalgia: Secondary | ICD-10-CM | POA: Diagnosis not present

## 2017-05-25 DIAGNOSIS — G8929 Other chronic pain: Secondary | ICD-10-CM | POA: Diagnosis not present

## 2017-09-01 DIAGNOSIS — M069 Rheumatoid arthritis, unspecified: Secondary | ICD-10-CM | POA: Diagnosis not present

## 2017-09-01 DIAGNOSIS — M08042 Unspecified juvenile rheumatoid arthritis, left hand: Secondary | ICD-10-CM | POA: Diagnosis not present

## 2017-09-01 DIAGNOSIS — M08041 Unspecified juvenile rheumatoid arthritis, right hand: Secondary | ICD-10-CM | POA: Diagnosis not present
